# Patient Record
Sex: Female | Born: 1955 | Race: Black or African American | Hispanic: No | Smoking: Never smoker
Health system: Southern US, Community
[De-identification: ages and names within clinical notes are randomized; demographics above are authoritative.]

## PROBLEM LIST (undated history)

## (undated) DIAGNOSIS — J45909 Unspecified asthma, uncomplicated: Secondary | ICD-10-CM

## (undated) DIAGNOSIS — K219 Gastro-esophageal reflux disease without esophagitis: Secondary | ICD-10-CM

## (undated) DIAGNOSIS — E7800 Pure hypercholesterolemia, unspecified: Secondary | ICD-10-CM

## (undated) DIAGNOSIS — D649 Anemia, unspecified: Secondary | ICD-10-CM

## (undated) DIAGNOSIS — F32A Depression, unspecified: Secondary | ICD-10-CM

## (undated) DIAGNOSIS — F329 Major depressive disorder, single episode, unspecified: Secondary | ICD-10-CM

## (undated) DIAGNOSIS — N183 Chronic kidney disease, stage 3 unspecified: Secondary | ICD-10-CM

## (undated) DIAGNOSIS — I2510 Atherosclerotic heart disease of native coronary artery without angina pectoris: Secondary | ICD-10-CM

## (undated) DIAGNOSIS — G4733 Obstructive sleep apnea (adult) (pediatric): Secondary | ICD-10-CM

## (undated) DIAGNOSIS — I10 Essential (primary) hypertension: Secondary | ICD-10-CM

## (undated) DIAGNOSIS — E669 Obesity, unspecified: Secondary | ICD-10-CM

## (undated) HISTORY — PX: TUBAL LIGATION: SHX77

## (undated) HISTORY — PX: NASAL SEPTUM SURGERY: SHX37

## (undated) HISTORY — PX: ABDOMINAL HYSTERECTOMY: SHX81

## (undated) HISTORY — PX: TONSILLECTOMY: SUR1361

## (undated) HISTORY — PX: REPLACEMENT TOTAL KNEE: SUR1224

## (undated) HISTORY — PX: ADENOIDECTOMY: SUR15

---

## 2000-03-08 ENCOUNTER — Emergency Department (HOSPITAL_COMMUNITY): Admission: EM | Admit: 2000-03-08 | Discharge: 2000-03-08 | Payer: Self-pay | Admitting: Emergency Medicine

## 2000-03-08 ENCOUNTER — Encounter: Payer: Self-pay | Admitting: Emergency Medicine

## 2000-04-26 ENCOUNTER — Encounter: Admission: RE | Admit: 2000-04-26 | Discharge: 2000-07-25 | Payer: Self-pay

## 2001-12-22 ENCOUNTER — Emergency Department (HOSPITAL_COMMUNITY): Admission: EM | Admit: 2001-12-22 | Discharge: 2001-12-22 | Payer: Self-pay | Admitting: Emergency Medicine

## 2004-05-04 ENCOUNTER — Emergency Department (HOSPITAL_COMMUNITY): Admission: EM | Admit: 2004-05-04 | Discharge: 2004-05-05 | Payer: Self-pay | Admitting: Emergency Medicine

## 2004-07-31 ENCOUNTER — Emergency Department (HOSPITAL_COMMUNITY): Admission: EM | Admit: 2004-07-31 | Discharge: 2004-08-01 | Payer: Self-pay | Admitting: Emergency Medicine

## 2005-03-04 ENCOUNTER — Emergency Department (HOSPITAL_COMMUNITY): Admission: EM | Admit: 2005-03-04 | Discharge: 2005-03-05 | Payer: Self-pay | Admitting: Emergency Medicine

## 2005-03-15 ENCOUNTER — Emergency Department (HOSPITAL_COMMUNITY): Admission: EM | Admit: 2005-03-15 | Discharge: 2005-03-16 | Payer: Self-pay | Admitting: Emergency Medicine

## 2007-12-01 ENCOUNTER — Emergency Department (HOSPITAL_COMMUNITY): Admission: EM | Admit: 2007-12-01 | Discharge: 2007-12-01 | Payer: Self-pay | Admitting: Emergency Medicine

## 2009-05-20 ENCOUNTER — Ambulatory Visit: Payer: Self-pay | Admitting: Internal Medicine

## 2009-05-21 ENCOUNTER — Inpatient Hospital Stay (HOSPITAL_COMMUNITY): Admission: EM | Admit: 2009-05-21 | Discharge: 2009-05-24 | Payer: Self-pay | Admitting: Emergency Medicine

## 2009-05-22 ENCOUNTER — Encounter: Payer: Self-pay | Admitting: Internal Medicine

## 2009-05-24 ENCOUNTER — Emergency Department (HOSPITAL_COMMUNITY): Admission: EM | Admit: 2009-05-24 | Discharge: 2009-05-24 | Payer: Self-pay | Admitting: Emergency Medicine

## 2009-10-29 ENCOUNTER — Observation Stay (HOSPITAL_COMMUNITY): Admission: EM | Admit: 2009-10-29 | Discharge: 2009-10-30 | Payer: Self-pay | Admitting: Emergency Medicine

## 2009-10-29 ENCOUNTER — Ambulatory Visit: Payer: Self-pay | Admitting: Cardiology

## 2010-12-21 LAB — BASIC METABOLIC PANEL
BUN: 19 mg/dL (ref 6–23)
CO2: 25 mEq/L (ref 19–32)
CO2: 28 mEq/L (ref 19–32)
Calcium: 9.3 mg/dL (ref 8.4–10.5)
Chloride: 105 mEq/L (ref 96–112)
Chloride: 107 mEq/L (ref 96–112)
Chloride: 108 mEq/L (ref 96–112)
Creatinine, Ser: 1.3 mg/dL — ABNORMAL HIGH (ref 0.4–1.2)
Creatinine, Ser: 1.64 mg/dL — ABNORMAL HIGH (ref 0.4–1.2)
GFR calc Af Amer: 51 mL/min — ABNORMAL LOW (ref >60)
GFR calc non Af Amer: 36 mL/min — ABNORMAL LOW (ref >60)
Glucose, Bld: 133 mg/dL — ABNORMAL HIGH (ref 70–99)
Glucose, Bld: 146 mg/dL — ABNORMAL HIGH (ref 70–99)
Potassium: 4 mEq/L (ref 3.5–5.1)
Sodium: 140 mEq/L (ref 135–145)

## 2010-12-21 LAB — CK TOTAL AND CKMB (NOT AT ARMC)
Relative Index: 0.8 (ref 0.0–2.5)
Total CK: 157 U/L (ref 7–177)

## 2010-12-21 LAB — GLUCOSE, CAPILLARY
Glucose-Capillary: 124 mg/dL — ABNORMAL HIGH (ref 70–99)
Glucose-Capillary: 210 mg/dL — ABNORMAL HIGH (ref 70–99)
Glucose-Capillary: 287 mg/dL — ABNORMAL HIGH (ref 70–99)
Glucose-Capillary: 78 mg/dL (ref 70–99)
Glucose-Capillary: 96 mg/dL (ref 70–99)

## 2010-12-21 LAB — DIFFERENTIAL
Basophils Absolute: 0 10*3/uL (ref 0.0–0.1)
Basophils Relative: 0 % (ref 0–1)
Eosinophils Absolute: 0.2 10*3/uL (ref 0.0–0.7)
Eosinophils Absolute: 0.3 10*3/uL (ref 0.0–0.7)
Lymphocytes Relative: 25 % (ref 12–46)
Lymphocytes Relative: 33 % (ref 12–46)
Monocytes Relative: 6 % (ref 3–12)
Monocytes Relative: 7 % (ref 3–12)
Neutro Abs: 4.3 10*3/uL (ref 1.7–7.7)
Neutrophils Relative %: 65 % (ref 43–77)

## 2010-12-21 LAB — POCT CARDIAC MARKERS
CKMB, poc: 1.1 ng/mL (ref 1.0–8.0)
Myoglobin, poc: 131 ng/mL (ref 12–200)
Troponin i, poc: <0.05 ng/mL (ref 0.00–0.09)

## 2010-12-21 LAB — CBC
HCT: 30.8 % — ABNORMAL LOW (ref 36.0–46.0)
Hemoglobin: 10.4 g/dL — ABNORMAL LOW (ref 12.0–15.0)
RBC: 3.77 MIL/uL — ABNORMAL LOW (ref 3.87–5.11)
WBC: 7.6 10*3/uL (ref 4.0–10.5)
WBC: 8.3 10*3/uL (ref 4.0–10.5)

## 2010-12-21 LAB — CARDIAC PANEL(CRET KIN+CKTOT+MB+TROPI)
CK, MB: 1.2 ng/mL (ref 0.3–4.0)
Relative Index: 0.9 (ref 0.0–2.5)
Total CK: 133 U/L (ref 7–177)

## 2010-12-21 LAB — D-DIMER, QUANTITATIVE: D-Dimer, Quant: 0.42 ug/mL-FEU (ref 0.00–0.48)

## 2010-12-22 ENCOUNTER — Emergency Department (HOSPITAL_COMMUNITY): Payer: Non-veteran care

## 2010-12-22 ENCOUNTER — Emergency Department (HOSPITAL_COMMUNITY)
Admission: EM | Admit: 2010-12-22 | Discharge: 2010-12-22 | Disposition: A | Discharge status: Home / Self Care | Payer: Non-veteran care | Attending: Emergency Medicine | Admitting: Emergency Medicine

## 2010-12-22 DIAGNOSIS — Z79899 Other long term (current) drug therapy: Secondary | ICD-10-CM | POA: Insufficient documentation

## 2010-12-22 DIAGNOSIS — R10813 Right lower quadrant abdominal tenderness: Secondary | ICD-10-CM | POA: Insufficient documentation

## 2010-12-22 DIAGNOSIS — I10 Essential (primary) hypertension: Secondary | ICD-10-CM | POA: Insufficient documentation

## 2010-12-22 DIAGNOSIS — Z794 Long term (current) use of insulin: Secondary | ICD-10-CM | POA: Insufficient documentation

## 2010-12-22 DIAGNOSIS — E7800 Pure hypercholesterolemia, unspecified: Secondary | ICD-10-CM | POA: Insufficient documentation

## 2010-12-22 DIAGNOSIS — F3289 Other specified depressive episodes: Secondary | ICD-10-CM | POA: Insufficient documentation

## 2010-12-22 DIAGNOSIS — E119 Type 2 diabetes mellitus without complications: Secondary | ICD-10-CM | POA: Insufficient documentation

## 2010-12-22 DIAGNOSIS — J45909 Unspecified asthma, uncomplicated: Secondary | ICD-10-CM | POA: Insufficient documentation

## 2010-12-22 DIAGNOSIS — M545 Low back pain, unspecified: Secondary | ICD-10-CM | POA: Insufficient documentation

## 2010-12-22 DIAGNOSIS — R1031 Right lower quadrant pain: Secondary | ICD-10-CM | POA: Insufficient documentation

## 2010-12-22 DIAGNOSIS — F329 Major depressive disorder, single episode, unspecified: Secondary | ICD-10-CM | POA: Insufficient documentation

## 2010-12-22 LAB — URINALYSIS, ROUTINE W REFLEX MICROSCOPIC
Bilirubin Urine: NEGATIVE
Hgb urine dipstick: NEGATIVE
Nitrite: NEGATIVE
Specific Gravity, Urine: 1.015 (ref 1.005–1.030)
pH: 5.5 (ref 5.0–8.0)

## 2010-12-22 LAB — CBC
HCT: 35.3 % — ABNORMAL LOW (ref 36.0–46.0)
MCV: 83.5 fL (ref 78.0–100.0)
Platelets: 313 10*3/uL (ref 150–400)
RBC: 4.23 MIL/uL (ref 3.87–5.11)
WBC: 7.6 10*3/uL (ref 4.0–10.5)

## 2010-12-22 LAB — POCT I-STAT, CHEM 8
Calcium, Ion: 1.16 mmol/L (ref 1.12–1.32)
Chloride: 107 mEq/L (ref 96–112)
Creatinine, Ser: 1.5 mg/dL — ABNORMAL HIGH (ref 0.4–1.2)
Potassium: 4.9 mEq/L (ref 3.5–5.1)
TCO2: 26 mmol/L (ref 0–100)

## 2010-12-22 LAB — DIFFERENTIAL
Lymphocytes Relative: 27 % (ref 12–46)
Lymphs Abs: 2.1 10*3/uL (ref 0.7–4.0)
Neutro Abs: 5 10*3/uL (ref 1.7–7.7)
Neutrophils Relative %: 65 % (ref 43–77)

## 2011-01-10 LAB — CARDIAC PANEL(CRET KIN+CKTOT+MB+TROPI)
CK, MB: 1 ng/mL (ref 0.3–4.0)
CK, MB: 1.1 ng/mL (ref 0.3–4.0)
CK, MB: 1.1 ng/mL (ref 0.3–4.0)
CK, MB: 1.2 ng/mL (ref 0.3–4.0)
Relative Index: 1.1 (ref 0.0–2.5)
Relative Index: INVALID (ref 0.0–2.5)
Total CK: 108 U/L (ref 7–177)
Total CK: 114 U/L (ref 7–177)
Total CK: 99 U/L (ref 7–177)
Troponin I: 0.01 ng/mL (ref 0.00–0.06)
Troponin I: 0.01 ng/mL (ref 0.00–0.06)
Troponin I: 0.01 ng/mL (ref 0.00–0.06)

## 2011-01-10 LAB — LIPID PANEL
LDL Cholesterol: 79 mg/dL (ref 0–99)
Triglycerides: 44 mg/dL (ref <150)

## 2011-01-10 LAB — POCT CARDIAC MARKERS
CKMB, poc: <1 ng/mL — ABNORMAL LOW (ref 1.0–8.0)
Myoglobin, poc: 144 ng/mL (ref 12–200)
Troponin i, poc: <0.05 ng/mL (ref 0.00–0.09)
Troponin i, poc: <0.05 ng/mL (ref 0.00–0.09)

## 2011-01-10 LAB — CBC
HCT: 29 % — ABNORMAL LOW (ref 36.0–46.0)
HCT: 29.1 % — ABNORMAL LOW (ref 36.0–46.0)
HCT: 29.5 % — ABNORMAL LOW (ref 36.0–46.0)
HCT: 31.8 % — ABNORMAL LOW (ref 36.0–46.0)
Hemoglobin: 10.5 g/dL — ABNORMAL LOW (ref 12.0–15.0)
Hemoglobin: 9.6 g/dL — ABNORMAL LOW (ref 12.0–15.0)
Hemoglobin: 9.9 g/dL — ABNORMAL LOW (ref 12.0–15.0)
MCHC: 33 g/dL (ref 30.0–36.0)
MCV: 81.3 fL (ref 78.0–100.0)
Platelets: 270 10*3/uL (ref 150–400)
RBC: 3.53 MIL/uL — ABNORMAL LOW (ref 3.87–5.11)
RBC: 3.63 MIL/uL — ABNORMAL LOW (ref 3.87–5.11)
RBC: 3.93 MIL/uL (ref 3.87–5.11)
RDW: 13.8 % (ref 11.5–15.5)
RDW: 14 % (ref 11.5–15.5)
WBC: 7.6 10*3/uL (ref 4.0–10.5)

## 2011-01-10 LAB — DIFFERENTIAL
Eosinophils Relative: 3 % (ref 0–5)
Lymphocytes Relative: 30 % (ref 12–46)
Lymphs Abs: 2.3 10*3/uL (ref 0.7–4.0)
Monocytes Absolute: 0.5 10*3/uL (ref 0.1–1.0)
Monocytes Relative: 7 % (ref 3–12)

## 2011-01-10 LAB — BASIC METABOLIC PANEL
BUN: 11 mg/dL (ref 6–23)
CO2: 25 mEq/L (ref 19–32)
CO2: 27 mEq/L (ref 19–32)
Calcium: 8.3 mg/dL — ABNORMAL LOW (ref 8.4–10.5)
Calcium: 8.9 mg/dL (ref 8.4–10.5)
Chloride: 112 mEq/L (ref 96–112)
Chloride: 112 mEq/L (ref 96–112)
Creatinine, Ser: 1.3 mg/dL — ABNORMAL HIGH (ref 0.4–1.2)
GFR calc Af Amer: 50 mL/min — ABNORMAL LOW (ref >60)
GFR calc Af Amer: 51 mL/min — ABNORMAL LOW (ref >60)
GFR calc non Af Amer: 21 mL/min — ABNORMAL LOW (ref >60)
GFR calc non Af Amer: 42 mL/min — ABNORMAL LOW (ref >60)
Glucose, Bld: 134 mg/dL — ABNORMAL HIGH (ref 70–99)
Glucose, Bld: 140 mg/dL — ABNORMAL HIGH (ref 70–99)
Glucose, Bld: 214 mg/dL — ABNORMAL HIGH (ref 70–99)
Potassium: 4 mEq/L (ref 3.5–5.1)
Potassium: 4.1 mEq/L (ref 3.5–5.1)
Potassium: 4.2 mEq/L (ref 3.5–5.1)
Potassium: 4.2 mEq/L (ref 3.5–5.1)
Sodium: 137 mEq/L (ref 135–145)
Sodium: 139 mEq/L (ref 135–145)
Sodium: 141 mEq/L (ref 135–145)

## 2011-01-10 LAB — GLUCOSE, CAPILLARY
Glucose-Capillary: 106 mg/dL — ABNORMAL HIGH (ref 70–99)
Glucose-Capillary: 118 mg/dL — ABNORMAL HIGH (ref 70–99)
Glucose-Capillary: 132 mg/dL — ABNORMAL HIGH (ref 70–99)
Glucose-Capillary: 160 mg/dL — ABNORMAL HIGH (ref 70–99)
Glucose-Capillary: 169 mg/dL — ABNORMAL HIGH (ref 70–99)
Glucose-Capillary: 187 mg/dL — ABNORMAL HIGH (ref 70–99)
Glucose-Capillary: 193 mg/dL — ABNORMAL HIGH (ref 70–99)
Glucose-Capillary: 248 mg/dL — ABNORMAL HIGH (ref 70–99)
Glucose-Capillary: 65 mg/dL — ABNORMAL LOW (ref 70–99)

## 2011-01-10 LAB — HEMOGLOBIN A1C: Mean Plasma Glucose: 200 mg/dL

## 2011-01-10 LAB — HEPARIN LEVEL (UNFRACTIONATED)
Heparin Unfractionated: 0.4 IU/mL (ref 0.30–0.70)
Heparin Unfractionated: 1.01 IU/mL — ABNORMAL HIGH (ref 0.30–0.70)

## 2011-01-10 LAB — TROPONIN I: Troponin I: 0.01 ng/mL (ref 0.00–0.06)

## 2011-01-10 LAB — TSH: TSH: 1.457 u[IU]/mL (ref 0.350–4.500)

## 2011-01-10 LAB — PROTIME-INR: INR: 1.1 (ref 0.00–1.49)

## 2011-02-17 NOTE — Discharge Summary (Signed)
Tara Patel, Tara Patel             ACCOUNT NO.:  [id number]   MEDICAL RECORD NO.:  [id number]          PATIENT TYPE:  INP   LOCATION:  3735                         FACILITY:  [facility]   PHYSICIAN:  Duke Salvia, MD, FACCDATE OF BIRTH:  [birth year]   DATE OF ADMISSION:  05/20/2009  DATE OF DISCHARGE:                               DISCHARGE SUMMARY   TIME FOR THIS DICTATION:  Greater than 40 minutes.   FINAL DIAGNOSES:  1. Admitted with right-sided chest pain with radiation to the right      arm and tingling in the left arm.      a.     Chest discomfort and radiations are relieved with sublingual       nitroglycerin.  2. Troponin I studies are negative.  3. Electrocardiogram was nondiagnostic for acute coronary syndrome.  4. Elevated creatinine this admission (1.8 creatinine baseline).      Admission creatinine was 2.39.  Mobic will be held at discharge.  5. Myoview stress test was done, May 22, 2009.  Results pending at      the time of this dictation.  They will follow in separate      dictation.   SECONDARY DIAGNOSES:  1. Risk equivalent diabetes.  2. Risk factors are:      a.     Hypertension.      b.     Dyslipidemia.      c.     Family history of coronary artery disease.  3. Chronic renal insufficiency.  4. Depression.  5. Obstructive sleep apnea.  6. Osteoarthritis.   Of note, the patient was noted to be mildly anemic with a hemoglobin of  9.9 at discharge, possibly a result of chronic diseases, hypertension,  diabetes, and chronic renal insufficiency.   BRIEF HISTORY:  Tara Patel is a 55 year old female.  She has a prior  history of diabetes, hypertension, dyslipidemia, and chronic renal  insufficiency.  She presents with chest pain which radiates to both arms  and cause left arm numbness.  She is not short of breath.  She received  nitroglycerin by emergency medical services and the pain resolved.  On  transportation, the patient was given aspirin.  In the  emergency room,  she was started on Plavix and heparin drip.  This was continued until  the patient had a stress test which was done on May 22, 2009.  She  was considered for catheterization, but her elevated creatinine obviated  that option at this admission.  The patient has remained essentially  chest pain-free throughout this admission.  Issues of discharge still to  be resolved and followup blood work of a CBC and basic metabolic panel  at [facility] at office visit for this patient on May 29, 2009, first  to check creatinine and her kidney function and also to check her  anemia.  Her hemoglobin A1c this admission was 8.6.  Her TSH is 1.457.  Of note, the patient will discharge.  If her stress test was negative  for ischemia, it will be interesting to see what her ejection fraction  is.  She is discharged on the following medications.  1. Novolin 70/30 25 units subcutaneously twice daily.      a.     At home she had been taking 52 units subcutaneously twice       daily.  This would have been way too much.  We have told the       patient that she could increase from 25 units twice daily if her       blood sugars start to go up at her home monitoring.  2. Aspirin 81 mg daily.  3. Calcium carbonate with vitamin D 600 mg tablets 2 tablets daily.  4. Fluticasone 1 spray each nostril twice daily.  5. Hydrochlorothiazide 25 mg daily.  6. Labetalol 200 mg.  The patient takes 2 tablets in the morning and 2      tablets in the evening.      a.     The patient was given 1 tablet twice daily and she was told       that she could go back to 2 tablets in the morning and 2 tablets       in the evening but if her blood pressure got too low, say a       systolic below 100 or the heart rate less than 60, she should go       back to 1 tablet in the morning and 1 tablet in the evening.  7. Lisinopril 10 mg daily at bedtime.  8. Omeprazole 20 mg daily.  9. Sertraline 50 mg 3 times daily.   10.Simvastatin 80 mg daily at bedtime.   MEDICATIONS STOPPED OR CHANGED:  Novolin 70/30 52 units changed to 25  units subcutaneously twice daily.  She is asked to stop taking Lasix.  She already has hydrochlorothiazide and she is asked to stop taking  meloxicam for preservation of kidney function.   FOLLOWUP:  Once again, she follows up at [facility] on May 29, 2009  with a basic metabolic panel and a complete blood count.   DISCHARGE LABORATORY DATA:  On the day of discharge, her complete blood  count was 6.6 white cells, hemoglobin 9.9, hematocrit 29.5, and  platelets of 260.  Sodium 141, potassium 4.2, chloride 112, carbonate  25, BUN is 13, creatinine 1.34, and glucose is 214.  Troponins have been  less than 0.05, then less than 0.05, and 0.01 this admission.  Hemoglobin A1c is 8.6.  TSH is 1.457.  The BNP on admission was less  than 30.  The patient had been on Plavix, this was stopped.  The patient  had been on IV heparin, this has also been stopped.      Tara Patel, [place]      Duke Salvia, MD, [facility]  Electronically Signed    GM/MEDQ  D:  05/22/2009  T:  05/23/2009  Job:  [id number]   cc:   [facility]

## 2011-02-17 NOTE — Discharge Summary (Signed)
Tara Patel, Tara Patel             ACCOUNT NO.:  [id number]   MEDICAL RECORD NO.:  [id number]           PATIENT TYPE:   LOCATION:                                 FACILITY:   PHYSICIAN:  Peter C. Eden Emms, MD, FACCDATE OF BIRTH:  [birth year]   DATE OF ADMISSION:  DATE OF DISCHARGE:                               DISCHARGE SUMMARY   ADDENDUM   Lisinopril dose increased from 10 to 20 mg p.o. daily and followup  appointment with Dr. Verne Carrow made for July 03, 2009 at  1:45 p.m.      Jarrett Ables, PAC      Peter C. Eden Emms, MD, [facility]  Electronically Signed    MS/MEDQ  D:  05/24/2009  T:  05/25/2009  Job:  [id number]

## 2011-02-17 NOTE — H&P (Signed)
Tara Patel, FACEY NO.:  [id number]   MEDICAL RECORD NO.:  [id number]          PATIENT TYPE:  EMS   LOCATION:  MAJO                         FACILITY:  [facility]   PHYSICIAN:  Therisa Doyne, MD    DATE OF BIRTH:  02/17/1956   DATE OF ADMISSION:  05/20/2009  DATE OF DISCHARGE:                              HISTORY & PHYSICAL   PRIMARY CARE Tara Patel:  [facility].   CHIEF COMPLAINT:  Chest pain.   HISTORY OF PRESENT ILLNESS:  A 55 year old [place] American female with  a past medical history significant for diabetes, hypertension,  hyperlipidemia and chronic renal insufficiency who presents for  evaluation of left-sided chest pain.  The patient's states that she was  in her usual state of health this evening and ate supper.  Subsequently  at 8:00 p.m., she developed a throbbing sensation in her left chest.  She noted that it radiated into her right arm and had tingling in her  left arm.  There was no associated shortness of breath or diaphoresis.  Because of these symptoms, she called emergency medical services who  gave her nitroglycerin and the symptoms alleviated.  In the emergency  department she was seen and evaluated where she had a normal EKG.  However, the emergency department physicians were concerned about her  chest pain, so they loaded her with Plavix, as well as started her on a  nitroglycerin drip and heparin drip for systemic anticoagulation.   The patient denies any previous history of cardiac disease, denying  previous heart catheterizations or prior myocardial infarctions.  At  baseline, she is quite sedentary and states that the most active thing  she does is walk from her car to her house.  She denies any history of  exertional chest pain, CHF, PND, orthopnea or lower extremity edema.  She denies any syncope.   REVIEW OF SYSTEMS:  All systems reviewed except and are negative except  as mentioned above in the history of present illness.   PAST MEDICAL HISTORY:  1. Diabetes.  2. Hypertension.  3. Hyperlipidemia.  4. Gastroesophageal reflux disease.  5. Depression.  6. Obstructive sleep apnea.  7. Osteoarthritis.   SOCIAL HISTORY:  The patient is unemployed.  Prior to that, she was a  CNA.  She denies tobacco, alcohol or drugs.   FAMILY HISTORY:  Positive for coronary disease in her mother at the age  of 66, who underwent coronary artery bypass grafting.   ALLERGIES:  NO KNOWN DRUG ALLERGIES.   MEDICATIONS:  1. Novolin 70/30 insulin 52 units b.i.d.  2. Aspirin 81 mg daily.  3. Calcium 1200 mg daily.  4. Lasix 40 mg daily.  5. Hydrochlorothiazide 25 mg daily.  6. Lisinopril 10 mg daily.  7. Omeprazole 20 mg daily.  8. Meloxicam 15 mg daily.  9. Simvastatin 80 mg daily.  10.Labetalol 200 mg b.i.d.  11.Sertraline 50 mg daily.  12.Fluticasone b.i.d.   PHYSICAL EXAMINATION:  VITAL SIGNS:  Temperature afebrile, blood  pressure is 112/58, pulse 86, respirations 18.  Oxygen saturation 99%  room air.  GENERAL:  No acute distress.  HEENT:  Normocephalic, atraumatic.  NECK:  Supple.  No lymphadenopathy or jugulovenous distention.  No  masses.  CARDIOVASCULAR:  Regular rate and rhythm.  No murmurs, rubs or gallops.  CHEST:  Clear to auscultation bilaterally.  ABDOMEN:  Positive bowel sounds.  Soft, nontender and nondistended.  EXTREMITIES:  No clubbing, cyanosis or edema.  Dorsalis pedis pulse 2+  bilaterally.  SKIN:  No rashes.  BACK:  No CVA tenderness.  NEUROLOGIC:  Cranial nerves II through XII are grossly intact with no  focal deficits.   PERTINENT DATA:  EKG shows normal sinus rhythm at 80 beats per minute,  normal axis, normal intervals, no active signs of ischemia, no Q-waves.   LABORATORY STUDIES:  Show a hemoglobin of 10.52, troponin less than 0.08  elevated BUN and creatinine of 26 and 2.4, glucose 134.   Chest x-ray showed no acute cardiopulmonary disease section.   ASSESSMENT/PLAN:  Tara Patel is  a 55 year old African American female  with multiple cardiovascular risk factors, including hypertension,  hyperlipidemia, diabetes, and chronic renal insufficiency, who presents  for evaluation of 30 minutes of chest pain.   1. We will admit the patient to [facility] Cardiology Service to the      unassigned physician.   1. Chest pain.  It certainly is reassuring that her EKG is normal as      are her cardiac biomarkers.  However, she has multiple risk factors      for cardiac disease.  Because of that, we will go ahead and admit      the patient and rule her out for myocardial infarction with serial      cardiac enzymes.  She has received a Plavix load in the emergency      department, as well as a heparin drip.  We will go ahead and      continue on aspirin 325 mg daily, as well as Plavix 75 mg daily.      Continue on her home dose of simvastatin and continue systemic      anticoagulation with heparin.  She is to continue on labetalol.  We      will not use a 2b/3a inhibitor unless the patient rules in for      myocardial infarction.  If she rules out for an myocardial      infarction, then I think a stress test would be a reasonable to      help risk stratify the patient.  I think this would be the best      choice given her chronic renal insufficiency.  However, if she has      high risk features such as abnormal electrocardiograms or positive      biomarkers, then we may need to consider a cardiac catheterization.   1. Hypertension, appears fairly well-controlled.  Continue      hydrochlorothiazide, lisinopril and labetalol.  We will be holding      her Lasix.   1. Chronic renal insufficiency.  Back in 2009, she had a creatinine of      1.8.  Currently, her creatinine is 2.4.  This may be her new      baseline or she may have some mild acute on chronic renal      insufficiency.  We will discontinue her Lasix as she is already on      one diuretic.  We will continue her on ACE  inhibitor for now as      this is nephro protective  in the setting of chronic renal      insufficiency.   1. Hyperlipidemia.  Continue simvastatin.  Check a fasting lipid      profile.   1. Diabetes mellitus.  We will check a hemoglobin A1c.  We will start      her home insulin regimen at half dosing as she will be n.p.o. for      possible stress test.   1. Gastroesophageal reflux disease.  Continue Nexium.   1. Depression.  Continue sertraline.   1. Fluid/Electrolyte/Nutrition/IV fluids:  Electrolytes are stable.      N.p.o.   1. DVT prophylaxis not indicated as the patient is on systemic      anticoagulation with heparin.      Therisa Doyne, MD  Electronically Signed     SJT/MEDQ  D:  05/21/2009  T:  05/21/2009  Job:  [id number]

## 2011-02-17 NOTE — Cardiovascular Report (Signed)
Tara Patel, Tara Patel             ACCOUNT NO.:  [id number]   MEDICAL RECORD NO.:  [id number]          PATIENT TYPE:  INP   LOCATION:  3735                         FACILITY:  [facility]   PHYSICIAN:  Verne Carrow, MDDATE OF BIRTH:  1956/08/29   DATE OF PROCEDURE:  05/23/2009  DATE OF DISCHARGE:                            CARDIAC CATHETERIZATION   PRIMARY CARDIOLOGIST:  Madolyn Frieze. Jens Som, MD, [facility]   PROCEDURE PERFORMED:  1. Left heart catheterization.  2. Selective coronary angiography.  3. Left ventricular angiogram.   OPERATOR:  Verne Carrow, MD   INDICATIONS:  Chest pain in a 55 year old patient with history of  diabetes mellitus, hypertension and hyperlipidemia.  The patient ruled  out for myocardial infarction with serial cardiac enzymes but was  referred for stress Myoview which showed possible anterior wall  ischemia.  She was referred for a diagnostic left heart catheterization  today to assess for obstructive coronary artery disease.   PROCEDURE IN DETAIL:  The patient was brought to the main cardiac  catheterization laboratory after signing informed consent for the  procedure.  The right groin was prepped and draped in a sterile fashion.  A 1% lidocaine was used for local anesthesia.  A 5-French sheath was  inserted into the right femoral artery without difficulty.  Standard  diagnostic catheters were used to perform selective coronary  angiography.  A pigtail catheter was used to perform the left  ventricular angiogram.  The patient tolerated the procedure well and was  taken to the holding area in stable condition.   HEMODYNAMIC FINDINGS:  Central aortic pressure 150/76.  Left ventricular  pressure 162/13.  Left ventricular end-diastolic pressure 17.   ANGIOGRAPHIC FINDINGS:  1. Left main coronary artery had no significant disease noted.  This      vessel bifurcated into the LAD and the circumflex.  2. The left anterior descending is a moderate-to-large  sized vessel      that courses to the apex.  It gives off several small diagonal      branches.  There is a 30% tubular stenosis noted throughout the      proximal portion.  There are serial 30% lesions noted throughout      the midportion of the vessel.  There is no obstructive disease      noted in this vessel.  3. The circumflex artery is a large-caliber vessel that gives off two      large marginal branches.  There is no obstructive disease in this      vessel.  4. The right coronary artery is a large dominant vessel that has no      evidence of obstructive disease.  5. Left ventricular angiogram was performed in the RAO projection that      shows normal left ventricular systolic function with no wall motion      abnormalities.  Ejection fraction 65-70%.   IMPRESSION:  1. Mild nonobstructive coronary artery disease.  2. Normal left ventricular systolic function.   RECOMMENDATIONS:  I recommend continued medical management in this  patient.      Verne Carrow, MD  Electronically Signed  CM/MEDQ  D:  05/23/2009  T:  05/24/2009  Job:  [id number]

## 2011-03-24 ENCOUNTER — Emergency Department (HOSPITAL_COMMUNITY)
Admission: EM | Admit: 2011-03-24 | Discharge: 2011-03-25 | Disposition: A | Discharge status: Home / Self Care | Payer: Self-pay | Attending: Emergency Medicine | Admitting: Emergency Medicine

## 2011-03-24 DIAGNOSIS — J039 Acute tonsillitis, unspecified: Secondary | ICD-10-CM | POA: Insufficient documentation

## 2011-03-24 DIAGNOSIS — I10 Essential (primary) hypertension: Secondary | ICD-10-CM | POA: Insufficient documentation

## 2011-03-24 DIAGNOSIS — R509 Fever, unspecified: Secondary | ICD-10-CM | POA: Insufficient documentation

## 2011-03-25 LAB — POCT I-STAT, CHEM 8
BUN: 18 mg/dL (ref 6–23)
Chloride: 101 mEq/L (ref 96–112)
Glucose, Bld: 303 mg/dL — ABNORMAL HIGH (ref 70–99)
HCT: 36 % (ref 36.0–46.0)
Potassium: 4.2 mEq/L (ref 3.5–5.1)

## 2011-06-26 LAB — I-STAT 8, (EC8 V) (CONVERTED LAB)
BUN: 16
Bicarbonate: 27.4 — ABNORMAL HIGH
Glucose, Bld: 143 — ABNORMAL HIGH
Operator id: 192351
pCO2, Ven: 49.1

## 2011-06-26 LAB — POCT CARDIAC MARKERS
CKMB, poc: <1 — ABNORMAL LOW
Myoglobin, poc: 141
Operator id: 133351
Operator id: 192351
Troponin i, poc: <0.05
Troponin i, poc: <0.05

## 2011-10-08 ENCOUNTER — Emergency Department (HOSPITAL_COMMUNITY): Payer: Non-veteran care

## 2011-10-08 ENCOUNTER — Encounter: Payer: Self-pay | Admitting: Emergency Medicine

## 2011-10-08 ENCOUNTER — Other Ambulatory Visit: Payer: Self-pay

## 2011-10-08 ENCOUNTER — Emergency Department (HOSPITAL_COMMUNITY)
Admission: EM | Admit: 2011-10-08 | Discharge: 2011-10-08 | Disposition: A | Discharge status: Home / Self Care | Payer: Non-veteran care | Attending: Emergency Medicine | Admitting: Emergency Medicine

## 2011-10-08 DIAGNOSIS — R0789 Other chest pain: Secondary | ICD-10-CM | POA: Insufficient documentation

## 2011-10-08 DIAGNOSIS — E785 Hyperlipidemia, unspecified: Secondary | ICD-10-CM | POA: Diagnosis present

## 2011-10-08 DIAGNOSIS — I10 Essential (primary) hypertension: Secondary | ICD-10-CM | POA: Diagnosis present

## 2011-10-08 DIAGNOSIS — R079 Chest pain, unspecified: Secondary | ICD-10-CM | POA: Diagnosis present

## 2011-10-08 DIAGNOSIS — E669 Obesity, unspecified: Secondary | ICD-10-CM | POA: Diagnosis present

## 2011-10-08 DIAGNOSIS — I129 Hypertensive chronic kidney disease with stage 1 through stage 4 chronic kidney disease, or unspecified chronic kidney disease: Secondary | ICD-10-CM | POA: Insufficient documentation

## 2011-10-08 DIAGNOSIS — Z794 Long term (current) use of insulin: Secondary | ICD-10-CM | POA: Insufficient documentation

## 2011-10-08 DIAGNOSIS — E119 Type 2 diabetes mellitus without complications: Secondary | ICD-10-CM | POA: Diagnosis present

## 2011-10-08 DIAGNOSIS — N183 Chronic kidney disease, stage 3 unspecified: Secondary | ICD-10-CM | POA: Diagnosis present

## 2011-10-08 DIAGNOSIS — R110 Nausea: Secondary | ICD-10-CM | POA: Insufficient documentation

## 2011-10-08 DIAGNOSIS — M79609 Pain in unspecified limb: Secondary | ICD-10-CM | POA: Insufficient documentation

## 2011-10-08 DIAGNOSIS — G4733 Obstructive sleep apnea (adult) (pediatric): Secondary | ICD-10-CM | POA: Diagnosis present

## 2011-10-08 DIAGNOSIS — E7800 Pure hypercholesterolemia, unspecified: Secondary | ICD-10-CM | POA: Insufficient documentation

## 2011-10-08 HISTORY — DX: Chronic kidney disease, stage 3 unspecified: N18.30

## 2011-10-08 HISTORY — DX: Obstructive sleep apnea (adult) (pediatric): G47.33

## 2011-10-08 HISTORY — DX: Obesity, unspecified: E66.9

## 2011-10-08 HISTORY — DX: Chronic kidney disease, stage 3 (moderate): N18.3

## 2011-10-08 HISTORY — DX: Essential (primary) hypertension: I10

## 2011-10-08 HISTORY — DX: Pure hypercholesterolemia, unspecified: E78.00

## 2011-10-08 LAB — BASIC METABOLIC PANEL
CO2: 27 mEq/L (ref 19–32)
Calcium: 9.6 mg/dL (ref 8.4–10.5)
Chloride: 106 mEq/L (ref 96–112)
Sodium: 143 mEq/L (ref 135–145)

## 2011-10-08 LAB — CARDIAC PANEL(CRET KIN+CKTOT+MB+TROPI)
CK, MB: 2.1 ng/mL (ref 0.3–4.0)
CK, MB: 2 ng/mL (ref 0.3–4.0)
Relative Index: 1.2 (ref 0.0–2.5)
Total CK: 174 U/L (ref 7–177)

## 2011-10-08 LAB — POCT I-STAT, CHEM 8
Calcium, Ion: 1.2 mmol/L (ref 1.12–1.32)
Chloride: 106 mEq/L (ref 96–112)
Creatinine, Ser: 1.2 mg/dL — ABNORMAL HIGH (ref 0.50–1.10)
Glucose, Bld: 88 mg/dL (ref 70–99)
HCT: 35 % — ABNORMAL LOW (ref 36.0–46.0)

## 2011-10-08 LAB — PROTIME-INR: Prothrombin Time: 13.4 seconds (ref 11.6–15.2)

## 2011-10-08 LAB — DIFFERENTIAL
Basophils Absolute: 0 10*3/uL (ref 0.0–0.1)
Lymphocytes Relative: 34 % (ref 12–46)
Neutro Abs: 4.9 10*3/uL (ref 1.7–7.7)

## 2011-10-08 LAB — GLUCOSE, CAPILLARY: Glucose-Capillary: 70 mg/dL (ref 70–99)

## 2011-10-08 LAB — CBC
Platelets: 253 10*3/uL (ref 150–400)
RDW: 13 % (ref 11.5–15.5)
WBC: 8.3 10*3/uL (ref 4.0–10.5)

## 2011-10-08 MED ORDER — MORPHINE SULFATE 2 MG/ML IJ SOLN
2.0000 mg | Freq: Once | INTRAMUSCULAR | Status: AC
  Administered 2011-10-08: 2 mg via INTRAVENOUS
  Filled 2011-10-08: qty 1

## 2011-10-08 MED ORDER — ONDANSETRON HCL 4 MG/2ML IJ SOLN
4.0000 mg | Freq: Once | INTRAMUSCULAR | Status: AC
  Administered 2011-10-08: 4 mg via INTRAVENOUS
  Filled 2011-10-08: qty 2

## 2011-10-08 NOTE — ED Provider Notes (Signed)
History     CSN: [id number]  Arrival date & time 10/08/11  0865   First MD Initiated Contact with Patient 10/08/11 [phone]      Chief Complaint  Patient presents with  . Chest Pain    (Consider location/radiation/quality/duration/timing/severity/associated sxs/prior treatment) HPI Comments: Patient presents with left-sided chest pain that woke her from sleep around 5:30. Pain radiates into her right arm. Is associated with nausea, no vomiting, no shortness of breath. She took 3 nitroglycerin without relief. Nothing makes the pain better. She similar pain she had 2 years ago when she had abnormal stress test and catheterization showed nonobstructive disease. She reports she is not had pain in the past 2 years ever since her last hospitalization August 2010. Is a history of hyperlipidemia, hypertension, diabetes and chronic in disease.  Patient is a 56 y.o. female presenting with chest pain. The history is provided by the patient.  Chest Pain The chest pain began 1 - 2 hours ago. Chest pain occurs constantly. The chest pain is unchanged. The severity of the pain is moderate. The quality of the pain is described as similar to previous episodes and aching. Primary symptoms include nausea. Pertinent negatives for primary symptoms include no fever, no shortness of breath, no cough, no abdominal pain, no vomiting and no dizziness.     Past Medical History  Diagnosis Date  . Diabetes mellitus   . Hypertension   . Hypercholesteremia   . Obesity   . CKD (chronic kidney disease) stage 3, GFR 30-59 ml/min   . OSA (obstructive sleep apnea)   . Chest pain     Mild nonobstructive CAD (by cath Aug '10)    Past Surgical History  Procedure Date  . Tonsillectomy   . Nose surgery     History reviewed. No pertinent family history.  History  Substance Use Topics  . Smoking status: Never Smoker   . Smokeless tobacco: Not on file  . Alcohol Use: No    OB History    Grav Para Term Preterm  Abortions TAB SAB Ect Mult Living                  Review of Systems  Constitutional: Positive for activity change and appetite change. Negative for fever.  HENT: Negative for congestion and rhinorrhea.   Respiratory: Negative for cough and shortness of breath.   Cardiovascular: Positive for chest pain.  Gastrointestinal: Positive for nausea. Negative for vomiting and abdominal pain.  Genitourinary: Negative for dysuria.  Musculoskeletal: Negative for back pain.  Skin: Negative for rash.  Neurological: Negative for dizziness.    Allergies  Review of patient's allergies indicates no known allergies.  Home Medications   Current Outpatient Rx  Name Route Sig Dispense Refill  . ACETAMINOPHEN 325 MG PO TABS Oral Take 650 mg by mouth every 6 (six) hours as needed. Pain or headache     . ASPIRIN EC 81 MG PO TBEC Oral Take 81 mg by mouth daily.      Marland Kitchen CALCIUM CARBONATE-VITAMIN D 600-400 MG-UNIT PO TABS Oral Take 2 tablets by mouth daily.      Marland Kitchen FERROUS SULFATE 325 (65 FE) MG PO TABS Oral Take 325 mg by mouth 2 (two) times daily.      Marland Kitchen HYDROCHLOROTHIAZIDE 25 MG PO TABS Oral Take 25 mg by mouth daily.      . INSULIN ASPART 100 UNIT/ML [place] SOLN Subcutaneous Inject 10-20 Units into the skin 3 (three) times daily before meals. Takes 10  at breakfast and lunch Takes 20 at supper     . INSULIN GLARGINE 100 UNIT/ML [place] SOLN Subcutaneous Inject 50 Units into the skin at bedtime.      Marland Kitchen LABETALOL HCL 200 MG PO TABS Oral Take 200 mg by mouth 2 (two) times daily.      Marland Kitchen LISINOPRIL 40 MG PO TABS Oral Take 40 mg by mouth daily.      Marland Kitchen LORATADINE 10 MG PO TABS Oral Take 10 mg by mouth daily. allergies     . OMEPRAZOLE 20 MG PO CPDR Oral Take 20 mg by mouth daily.      Bernadette Hoit SODIUM 8.6-50 MG PO TABS Oral Take 1 tablet by mouth daily. constipation     . SERTRALINE HCL 50 MG PO TABS Oral Take 50 mg by mouth 3 (three) times daily.      Marland Kitchen SIMVASTATIN 40 MG PO TABS Oral Take 20 mg by mouth at  bedtime.        BP 111/68  Pulse 73  Temp(Src) 97.9 F (36.6 C) (Oral)  Resp 14  SpO2 98%  Physical Exam  Constitutional: She is oriented to person, place, and time. She appears well-developed. No distress.       Uncomfortable  HENT:  Head: Normocephalic and atraumatic.  Mouth/Throat: Oropharynx is clear and moist. No oropharyngeal exudate.  Eyes: Conjunctivae are normal. Pupils are equal, round, and reactive to light.  Neck: Normal range of motion. Neck supple.  Cardiovascular: Normal rate, regular rhythm and normal heart sounds.        Intact peripheral pulses  Pulmonary/Chest: Effort normal and breath sounds normal. No respiratory distress.  Abdominal: Soft. There is no tenderness. There is no rebound and no guarding.  Musculoskeletal: Normal range of motion. She exhibits no edema and no tenderness.  Neurological: She is oriented to person, place, and time. No cranial nerve deficit.  Skin: Skin is warm.    ED Course  Procedures (including critical care time)  Labs Reviewed  CBC - Abnormal; Notable for the following:    Hemoglobin 11.1 (*)    HCT 33.8 (*)    All other components within normal limits  BASIC METABOLIC PANEL - Abnormal; Notable for the following:    Creatinine, Ser 1.22 (*)    GFR calc non Af Amer 48 (*)    GFR calc Af Amer 56 (*)    All other components within normal limits  POCT I-STAT, CHEM 8 - Abnormal; Notable for the following:    Creatinine, Ser 1.20 (*)    Hemoglobin 11.9 (*)    HCT 35.0 (*)    All other components within normal limits  DIFFERENTIAL  CARDIAC PANEL(CRET KIN+CKTOT+MB+TROPI)  PROTIME-INR  CARDIAC PANEL(CRET KIN+CKTOT+MB+TROPI)  GLUCOSE, CAPILLARY   Dg Chest 2 View  10/08/2011  *RADIOLOGY REPORT*  Clinical Data: Chest pain.  CHEST - 2 VIEW  Comparison: Chest x-ray 10/28/2009.  Findings: The cardiac silhouette, mediastinal and hilar contours are within normal limits and stable.  Chronic bronchitic type lung changes.  No effusion  or infiltrate.  IMPRESSION: Chronic bronchitic type lung changes without infiltrate or effusion.  Original Report Authenticated By: P. Loralie Champagne, M.D.     1. Chest pain   2. Hyperlipidemia   3. Hypertension       MDM  Left-sided chest pain radiating into the right arm and nausea shortness of breath. Similar to previous episodes. Unrelieved by nitroglycerin.   Patient does have risk factors of diabetes, hyperlipidemia, CAD. Her  pain however is atypical and similar to previous episodes. She did have a cardiac catheterization August 2010 which showed nonobstructive disease.  Case discussed with cardiology Dr. Johney Frame who came to evaluate the patient. They agree her symptoms are atypical and similar to previous.  Recommend second set of enzymes and outpatient followup.  Two sets of enzymes negative.  Pain improved.  [facility]   Date: 10/08/2011  Rate: 82  Rhythm: normal sinus rhythm  QRS Axis: normal  Intervals: normal  ST/T Wave abnormalities: nonspecific ST/T changes  Conduction Disutrbances:none  Narrative Interpretation: anterior T wave flattening  Old EKG Reviewed: none available          Glynn Octave, MD 10/08/11 1831

## 2011-10-08 NOTE — Consult Note (Signed)
Consult Note  Patient ID: EATHER CHAIRES MRN: [id number], SOB: 1956-04-06 56 y.o. Date of Encounter: 10/08/2011, 8:51 AM  Primary Physician: Ria Clock Primary Cardiologist: Verne Carrow MD  Chief Complaint: Chest Pain Reason for Consultation: Chest Pain  HPI: 55yof w/ PMHx significant for OSA, obesity, CKD (stage 3), HLD, HTN, DM, and mild Nonobstructive CAD (by cath Aug '10) who presented to [facility] on 10/08/2011 with complaints of right arm and chest pain.  She had similar type pain in August 2010 at which time she underwent a stress Myoview that revealed possible anterior wall ischemia. A heart catheterization was performed that revealed mild nonobstructive CAD with EF 65-70%. She presented to [facility] again in January 2011 with chest pain that was ruled out for acute ischemia and thought to be related to GERD or gastroparesis.   She reports being in her usual state of health when she woke up at around 5:30 this morning to use the bathroom. When she returned to bed she developed a dull pain in her right arm followed by right-sided throbbing chest pain that was an 8/10. After she took SL NTG x3 as well as 162mg  ASA without relief and then presented to the ED. She reports some mild nausea with the pain, but no shortness of breath, diaphoresis, dizziness or other associated symptoms. She denies any recent illness, fever, chills, sweats, weight change, edema, orthopnea, pre/syncope, changes in bowel or bladder. She walks about a day without c/o chest pain, but does have stable dyspnea on exertion after about of walking. Her blood sugars have been more elevated (in the 200s) in the last couple of weeks.  In the ED she received morphine and zofran which relieved her pain, but she reports intermittent sharp chest pains that last a few seconds as well as intermittent dull right arm pain. She is chest pain free now and has reproducible arm and chest pain with  palpation. Her EKG revealed Normal Sinus Rhythm 82bpm, No acute ST/T wave changes, initial cardiac enzyme was negative, and CXR was without acute cardiopulmonary findings.   Diagnosis  . Diabetes mellitus  . Hypertension  . Hypercholesteremia    Surgical History:  Procedure  . Tonsillectomy  . Nose surgery    Home Meds: Medication Sig  acetaminophen (TYLENOL) 325 MG tablet Take 650 mg by mouth every 6 (six) hours as needed. Pain or headache   aspirin EC 81 MG tablet Take 81 mg by mouth daily.    Calcium Carbonate-Vitamin D (CALCIUM 600+D) 600-400 MG-UNIT per tablet Take 2 tablets by mouth daily.    ferrous sulfate 325 (65 FE) MG tablet Take 325 mg by mouth 2 (two) times daily.    hydrochlorothiazide (HYDRODIURIL) 25 MG tablet Take 25 mg by mouth daily.    insulin aspart (NOVOLOG) 100 UNIT/ML injection Inject 10-20 Units into the skin 3 (three) times daily before meals. Takes 10 at breakfast and lunch Takes 20 at supper   insulin glargine (LANTUS) 100 UNIT/ML injection Inject 50 Units into the skin at bedtime.    labetalol (NORMODYNE) 200 MG tablet Take 200 mg by mouth 2 (two) times daily.    lisinopril (PRINIVIL,ZESTRIL) 40 MG tablet Take 40 mg by mouth daily.    loratadine (CLARITIN) 10 MG tablet Take 10 mg by mouth daily. allergies   omeprazole (PRILOSEC) 20 MG capsule Take 20 mg by mouth daily.    senna-docusate (SENOKOT-S) 8.6-50 MG per tablet Take 1 tablet by mouth daily. constipation  sertraline (ZOLOFT) 50 MG tablet Take 50 mg by mouth 3 (three) times daily.    simvastatin (ZOCOR) 40 MG tablet Take 20 mg by mouth at bedtime.      Allergies: No Known Allergies  Social History  . Marital Status: Divorced   Lives in [place] with her parents    Occupational History  . Unemployed   Social History Main Topics  . Smoking status: Never Smoker   . Alcohol Use: No  . Drug Use: No   Family History: Mother had CABG at 79yo  Review of Systems: General: negative for  chills, fever, night sweats or weight changes.  Cardiovascular: As per HPI; otherwise negative for edema, orthopnea, palpitations, paroxysmal nocturnal dyspnea, or shortness of breath Dermatological: negative for rash Respiratory: negative for cough or wheezing Urologic: negative for hematuria Abdominal: negative for vomiting, diarrhea, bright red blood per rectum, melena, or hematemesis Neurologic: negative for visual changes, syncope, or dizziness All other systems reviewed and are otherwise negative except as noted above.  Labs:   Component Value Date   WBC 8.3 10/08/2011   HGB 11.9* 10/08/2011   HCT 35.0* 10/08/2011   MCV 81.6 10/08/2011   PLT 253 10/08/2011    Lab 10/08/11 0739 10/08/11 0711  NA 143 --  K 3.8 --  CL 106 --  CO2 -- 27  BUN 18 --  CREATININE 1.20* --  CALCIUM -- 9.6  GLUCOSE 88 --   Basename 10/08/11 0714  CKTOTAL 174  CKMB 2.1  TROPONINI <0.30     10/08/2011 07:11  Prothrombin Time 13.4  INR 1.00    Radiology/Studies:  Dg Chest 2 View 10/08/2011  Findings: The cardiac silhouette, mediastinal and hilar contours are within normal limits and stable.  Chronic bronchitic type lung changes.  No effusion or infiltrate.  IMPRESSION: Chronic bronchitic type lung changes without infiltrate or effusion.     EKG: 10/08/11 @ 0644 - Normal Sinus Rhythm 82bpm, No acute ST/T wave changes  Physical Exam: Blood pressure 108/44, pulse 90, temperature 98.6 F (37 C), temperature source Oral, resp. rate 18, SpO2 99.00%. General: Overweight black female, in no acute distress. Head: Normocephalic, atraumatic, sclera non-icteric, nares are without discharge Neck: Supple. Negative for carotid bruits. JVD not elevated. Lungs: Clear bilaterally to auscultation without wheezes, rales, or rhonchi. Breathing is unlabored. Heart: RRR with S1 S2. No murmurs, rubs, or gallops appreciated. Abdomen: Soft, non-tender, non-distended with normoactive bowel sounds. No rebound/guarding. No obvious  abdominal masses. Msk:  Strength and tone appear normal for age. Extremities:  Trace bilat LE edema. No clubbing or cyanosis. Distal pedal pulses are 2+ and equal bilaterally. Neuro: Alert and oriented X 3. Moves all extremities spontaneously. Psych:  Responds to questions appropriately with a flat affect.   ASSESSMENT AND PLAN:  55yof w/ PMHx significant for OSA, obesity, CKD (stage 3), HLD, HTN, DM, mild Nonobstructive CAD (by cath Aug '10) who presented to [facility] on 10/08/2011 with complaints of chest pain.  1. Chest pain:  She had mild nonobstructive CAD by cath in Aug 2010. EKG and initial cardiac enzyme without objective evidence of acute ischemia, however, she has multiple risk factors for CAD including obesity, HLD, HTN, DM, and family history. Her pain is atypical and reproducible with palpation and is most likely musculoskeletal in nature. Would recommend repeating cardiac enzymes 6hrs after initial set. If negative can follow up with her PCP as an outpatient. Cont ASA, BB, statin, and PRN nitro. Take Tylenol as needed for pain.  If she develops shortness of breath or worsening pain return to the emergency room.  2. HTN: Stable.  3. HLD: Cont statin  4. Diabetes Mellitus: She reports elevated blood sugars recently, however, glucose 88 here. Cont home meds.  5. CKD Stage 3: Creatinine stable at 1.20.   Signed, HOPE, JESSICA PA-C 10/08/2011, 8:51 AM  I have seen, examined the patient, and reviewed the above assessment and plan. Briefly, 56 yo AAF with h/o atypical chest pain and low risk cath.  She now returns with right arm and right upper chest discomfort.  Pain not improved with slNTG but resolved with morphine.  She is presently chest pain free.  First set of CMs are negative and EKG is normal.  On exam, she appears very comfortable.  She has mild ttp over the R antecubital fossa and also R pectoralis region which is well localized and per pt reproduces her clinical  pain. Her pain appears to be musculoskeletal in nature. If second set of CMs are normal, she can be discharged with outpatient follow-up by PCP.  Tylenol as needed for pain. She should return if chest pain worsens or she develops SOB or other concerns.  Co Sign: Hillis Range, MD 10/08/2011 10:16 AM

## 2011-10-08 NOTE — ED Notes (Signed)
Pt in bed resting with lights low, pt requests to have CBG completed, pt states, "I want to have my sugar checked I am feeling sweaty. I feel like my sugar is low."

## 2011-10-08 NOTE — ED Notes (Signed)
Pt discharged home. Encouraged to follow up with PCP. 

## 2011-10-08 NOTE — ED Notes (Signed)
PT. REPORTS LEFT CHEST PAIN AND RIGHT ARM PAIN ONSET THIS MORNING ,  DENIES SOB , SLIGHT NAUSEA , NO VOMITTING OR DIAPHORESIS,  TOOK 3 NTG SL AND 2 BABY ASA PRIOR TO ARRIVAL WITH NO RELIEF.

## 2013-02-25 ENCOUNTER — Encounter (HOSPITAL_COMMUNITY): Payer: Self-pay

## 2013-02-25 ENCOUNTER — Emergency Department (HOSPITAL_COMMUNITY)
Admission: EM | Admit: 2013-02-25 | Discharge: 2013-02-25 | Disposition: A | Discharge status: Home / Self Care | Payer: Non-veteran care | Attending: Emergency Medicine | Admitting: Emergency Medicine

## 2013-02-25 DIAGNOSIS — R6883 Chills (without fever): Secondary | ICD-10-CM | POA: Insufficient documentation

## 2013-02-25 DIAGNOSIS — R05 Cough: Secondary | ICD-10-CM | POA: Insufficient documentation

## 2013-02-25 DIAGNOSIS — E119 Type 2 diabetes mellitus without complications: Secondary | ICD-10-CM | POA: Insufficient documentation

## 2013-02-25 DIAGNOSIS — Z79899 Other long term (current) drug therapy: Secondary | ICD-10-CM | POA: Insufficient documentation

## 2013-02-25 DIAGNOSIS — Z7982 Long term (current) use of aspirin: Secondary | ICD-10-CM | POA: Insufficient documentation

## 2013-02-25 DIAGNOSIS — I129 Hypertensive chronic kidney disease with stage 1 through stage 4 chronic kidney disease, or unspecified chronic kidney disease: Secondary | ICD-10-CM | POA: Insufficient documentation

## 2013-02-25 DIAGNOSIS — E669 Obesity, unspecified: Secondary | ICD-10-CM | POA: Insufficient documentation

## 2013-02-25 DIAGNOSIS — Z8679 Personal history of other diseases of the circulatory system: Secondary | ICD-10-CM | POA: Insufficient documentation

## 2013-02-25 DIAGNOSIS — E7800 Pure hypercholesterolemia, unspecified: Secondary | ICD-10-CM | POA: Insufficient documentation

## 2013-02-25 DIAGNOSIS — N183 Chronic kidney disease, stage 3 unspecified: Secondary | ICD-10-CM | POA: Insufficient documentation

## 2013-02-25 DIAGNOSIS — I2510 Atherosclerotic heart disease of native coronary artery without angina pectoris: Secondary | ICD-10-CM | POA: Insufficient documentation

## 2013-02-25 DIAGNOSIS — G4733 Obstructive sleep apnea (adult) (pediatric): Secondary | ICD-10-CM | POA: Insufficient documentation

## 2013-02-25 DIAGNOSIS — R059 Cough, unspecified: Secondary | ICD-10-CM | POA: Insufficient documentation

## 2013-02-25 DIAGNOSIS — J029 Acute pharyngitis, unspecified: Secondary | ICD-10-CM | POA: Insufficient documentation

## 2013-02-25 DIAGNOSIS — J3489 Other specified disorders of nose and nasal sinuses: Secondary | ICD-10-CM | POA: Insufficient documentation

## 2013-02-25 DIAGNOSIS — Z794 Long term (current) use of insulin: Secondary | ICD-10-CM | POA: Insufficient documentation

## 2013-02-25 LAB — CBC WITH DIFFERENTIAL/PLATELET
Basophils Absolute: 0 10*3/uL (ref 0.0–0.1)
HCT: 33.9 % — ABNORMAL LOW (ref 36.0–46.0)
Hemoglobin: 11 g/dL — ABNORMAL LOW (ref 12.0–15.0)
Lymphocytes Relative: 21 % (ref 12–46)
Monocytes Absolute: 0.5 10*3/uL (ref 0.1–1.0)
Neutro Abs: 5.4 10*3/uL (ref 1.7–7.7)
RDW: 13 % (ref 11.5–15.5)
WBC: 7.7 10*3/uL (ref 4.0–10.5)

## 2013-02-25 MED ORDER — ONDANSETRON HCL 4 MG/2ML IJ SOLN: 4.0000 mg | Freq: Once | INTRAMUSCULAR | Status: DC

## 2013-02-25 MED ORDER — SODIUM CHLORIDE 0.9 % IV BOLUS (SEPSIS): 1000.0000 mL | Freq: Once | INTRAVENOUS | Status: DC

## 2013-02-25 NOTE — ED Notes (Signed)
Patient presents today with cough and sore throat. Stated that it started about 7 days ago and has gotten progressively worse. Patient's daughter and and grandson were sick but they did not have sore throat. Patient also states that she is achy all over with chills. Not coughing up anything until now when I swabbed her throat. Appears to be yellow sputum.

## 2013-02-25 NOTE — ED Provider Notes (Signed)
Medical screening examination/treatment/procedure(s) were performed by non-physician practitioner and as supervising physician I was immediately available for consultation/collaboration.   Dione Booze, MD 02/25/13 [phone]

## 2013-02-25 NOTE — ED Provider Notes (Signed)
History     CSN: [id number]  Arrival date & time 02/25/13  0915   First MD Initiated Contact with Patient 02/25/13 [phone]      Chief Complaint  Patient presents with  . Sore Throat    (Consider location/radiation/quality/duration/timing/severity/associated sxs/prior treatment) HPI Comments: 57 y.o. Female with PMHx of CKD (stage 3), HLD, HTN, DM, obstructive sleep apnea, and mild Nonobstructive CAD (by cath Aug '10) presents today with sore throat that started over a week ago, gradually worsening. Hurts more when she swallows. Pt states in the the last few days, a wet cough has developed as well. Worse at night. Pt admits rhinorrhea,chills. Denies fever, nausea, vomiting, abd pain, shortness of breath, dyspnea. Pt has taken a variety of OTC cold meds with no relief (Aleve, Alka Seltzer Plus) Pt states she has been around family members who were sick with similar cold-like symptoms (runny nose and cough), but who did not have a sore throat.   Pt states that she goes to the [place] for her primary health care needs.     Patient is a 57 y.o. female presenting with pharyngitis.  Sore Throat Associated symptoms include chills, congestion and a sore throat. Pertinent negatives include no abdominal pain, chest pain, diaphoresis, fever, headaches, nausea, neck pain, numbness, rash, vomiting or weakness.    Past Medical History  Diagnosis Date  . Diabetes mellitus   . Hypertension   . Hypercholesteremia   . Obesity   . CKD (chronic kidney disease) stage 3, GFR 30-59 ml/min   . OSA (obstructive sleep apnea)   . Chest pain     Mild nonobstructive CAD (by cath Aug '10)    Past Surgical History  Procedure Laterality Date  . Tonsillectomy    . Nose surgery      No family history on file.  History  Substance Use Topics  . Smoking status: Never Smoker   . Smokeless tobacco: Not on file  . Alcohol Use: No    OB History   Grav Para Term Preterm Abortions TAB SAB Ect Mult Living           Review of Systems  Constitutional: Positive for chills. Negative for fever and diaphoresis.  HENT: Positive for congestion, sore throat and rhinorrhea. Negative for ear pain, trouble swallowing, neck pain, neck stiffness and sinus pressure.   Eyes: Negative for visual disturbance.  Respiratory: Negative for apnea, chest tightness, shortness of breath and wheezing.   Cardiovascular: Negative for chest pain and palpitations.  Gastrointestinal: Negative for nausea, vomiting, abdominal pain, diarrhea and constipation.  Genitourinary: Negative for dysuria.  Musculoskeletal: Negative for gait problem.  Skin: Negative for rash.  Neurological: Negative for dizziness, weakness, light-headedness, numbness and headaches.    Allergies  Review of patient's allergies indicates no known allergies.  Home Medications   Current Outpatient Rx  Name  Route  Sig  Dispense  Refill  . acetaminophen (TYLENOL) 325 MG tablet   Oral   Take 650 mg by mouth every 6 (six) hours as needed. Pain or headache          . aspirin EC 81 MG tablet   Oral   Take 81 mg by mouth daily.           . Calcium Carbonate-Vitamin D (CALCIUM 600+D) 600-400 MG-UNIT per tablet   Oral   Take 2 tablets by mouth daily.           . ferrous sulfate 325 (65 FE) MG tablet   Oral  Take 325 mg by mouth 2 (two) times daily.           . hydrochlorothiazide (HYDRODIURIL) 25 MG tablet   Oral   Take 25 mg by mouth daily.           . insulin aspart (NOVOLOG) 100 UNIT/ML injection   Subcutaneous   Inject 10-20 Units into the skin 3 (three) times daily before meals. Takes 10 at breakfast and lunch Takes 20 at supper          . insulin glargine (LANTUS) 100 UNIT/ML injection   Subcutaneous   Inject 50 Units into the skin at bedtime.           Marland Kitchen labetalol (NORMODYNE) 200 MG tablet   Oral   Take 200 mg by mouth 2 (two) times daily.           Marland Kitchen lisinopril (PRINIVIL,ZESTRIL) 40 MG tablet   Oral   Take 40  mg by mouth daily.           Marland Kitchen loratadine (CLARITIN) 10 MG tablet   Oral   Take 10 mg by mouth daily. allergies          . omeprazole (PRILOSEC) 20 MG capsule   Oral   Take 20 mg by mouth daily.           Marland Kitchen senna-docusate (SENOKOT-S) 8.6-50 MG per tablet   Oral   Take 1 tablet by mouth daily. constipation          . sertraline (ZOLOFT) 50 MG tablet   Oral   Take 50 mg by mouth 3 (three) times daily.           . simvastatin (ZOCOR) 40 MG tablet   Oral   Take 20 mg by mouth at bedtime.             BP 163/75  Temp(Src) 98.7 F (37.1 C) (Oral)  Resp 20  SpO2 97%  Physical Exam  Nursing note and vitals reviewed. Constitutional: She is oriented to person, place, and time. She appears well-developed and well-nourished. No distress.  HENT:  Head: Normocephalic and atraumatic. No trismus in the jaw.  Right Ear: Tympanic membrane normal.  Left Ear: Tympanic membrane normal.  Nose: Rhinorrhea present.  Mouth/Throat: Posterior oropharyngeal erythema present. No oropharyngeal exudate or posterior oropharyngeal edema.  Eyes: Conjunctivae and EOM are normal. Right eye exhibits no discharge. Left eye exhibits no discharge.  Neck: Normal range of motion. Neck supple.  No meningeal signs  Cardiovascular: Normal rate, regular rhythm, normal heart sounds and intact distal pulses.  Exam reveals no gallop and no friction rub.   No murmur heard. Pulmonary/Chest: Effort normal and breath sounds normal. No respiratory distress. She has no wheezes. She has no rales. She exhibits no tenderness.  Abdominal: Soft. Bowel sounds are normal. She exhibits no distension. There is no tenderness. There is no rebound and no guarding.  Musculoskeletal: Normal range of motion. She exhibits no edema and no tenderness.  Lymphadenopathy:    She has no cervical adenopathy.  Neurological: She is alert and oriented to person, place, and time. No cranial nerve deficit.  Skin: Skin is warm and dry.  She is not diaphoretic. No erythema.  Psychiatric: She has a normal mood and affect.    ED Course  Procedures (including critical care time)  Labs Reviewed  CBC WITH DIFFERENTIAL - Abnormal; Notable for the following:    Hemoglobin 11.0 (*)    HCT 33.9 (*)  All other components within normal limits  RAPID STREP SCREEN  CULTURE, GROUP A STREP   No results found.   1. Viral pharyngitis       MDM  Pt afebrile without tonsillar exudate, cervical lymphadenopathy, fever. Mild dysphagia and cough. Lungs CTA. Not concerning for pneumonia.  No trismus or uvula deviation. Presentation non concerning for PTA or infxn spread to soft tissue. Rapid strep negative. Pt does not appear dehydrated, but did discuss importance of water rehydration. Recommended PCP follow up.Discussed reasons to seek immediate care. Patient expresses understanding and agrees with plan.    Glade Nurse, PA-C 02/25/13 1558

## 2013-02-27 LAB — CULTURE, GROUP A STREP

## 2013-10-19 ENCOUNTER — Observation Stay (HOSPITAL_COMMUNITY)
Admission: EM | Admit: 2013-10-19 | Discharge: 2013-10-20 | Disposition: A | Discharge status: Home / Self Care | Payer: Non-veteran care | Attending: Internal Medicine | Admitting: Internal Medicine

## 2013-10-19 ENCOUNTER — Other Ambulatory Visit: Payer: Self-pay | Admitting: Cardiology

## 2013-10-19 ENCOUNTER — Emergency Department (HOSPITAL_COMMUNITY): Payer: Non-veteran care

## 2013-10-19 ENCOUNTER — Encounter (HOSPITAL_COMMUNITY): Payer: Self-pay | Admitting: Emergency Medicine

## 2013-10-19 DIAGNOSIS — D649 Anemia, unspecified: Secondary | ICD-10-CM | POA: Diagnosis present

## 2013-10-19 DIAGNOSIS — I2510 Atherosclerotic heart disease of native coronary artery without angina pectoris: Secondary | ICD-10-CM | POA: Insufficient documentation

## 2013-10-19 DIAGNOSIS — F32A Depression, unspecified: Secondary | ICD-10-CM | POA: Diagnosis present

## 2013-10-19 DIAGNOSIS — N183 Chronic kidney disease, stage 3 unspecified: Secondary | ICD-10-CM | POA: Diagnosis present

## 2013-10-19 DIAGNOSIS — K219 Gastro-esophageal reflux disease without esophagitis: Secondary | ICD-10-CM | POA: Diagnosis present

## 2013-10-19 DIAGNOSIS — J42 Unspecified chronic bronchitis: Secondary | ICD-10-CM | POA: Diagnosis present

## 2013-10-19 DIAGNOSIS — F329 Major depressive disorder, single episode, unspecified: Secondary | ICD-10-CM | POA: Insufficient documentation

## 2013-10-19 DIAGNOSIS — M7989 Other specified soft tissue disorders: Secondary | ICD-10-CM | POA: Insufficient documentation

## 2013-10-19 DIAGNOSIS — E876 Hypokalemia: Secondary | ICD-10-CM | POA: Diagnosis present

## 2013-10-19 DIAGNOSIS — R059 Cough, unspecified: Secondary | ICD-10-CM | POA: Diagnosis present

## 2013-10-19 DIAGNOSIS — E119 Type 2 diabetes mellitus without complications: Secondary | ICD-10-CM | POA: Diagnosis present

## 2013-10-19 DIAGNOSIS — R079 Chest pain, unspecified: Principal | ICD-10-CM | POA: Diagnosis present

## 2013-10-19 DIAGNOSIS — R05 Cough: Secondary | ICD-10-CM

## 2013-10-19 DIAGNOSIS — Z794 Long term (current) use of insulin: Secondary | ICD-10-CM | POA: Insufficient documentation

## 2013-10-19 DIAGNOSIS — I10 Essential (primary) hypertension: Secondary | ICD-10-CM | POA: Diagnosis present

## 2013-10-19 DIAGNOSIS — F3289 Other specified depressive episodes: Secondary | ICD-10-CM | POA: Insufficient documentation

## 2013-10-19 DIAGNOSIS — Z79899 Other long term (current) drug therapy: Secondary | ICD-10-CM | POA: Insufficient documentation

## 2013-10-19 DIAGNOSIS — Z8249 Family history of ischemic heart disease and other diseases of the circulatory system: Secondary | ICD-10-CM | POA: Insufficient documentation

## 2013-10-19 DIAGNOSIS — E785 Hyperlipidemia, unspecified: Secondary | ICD-10-CM | POA: Diagnosis present

## 2013-10-19 DIAGNOSIS — G4733 Obstructive sleep apnea (adult) (pediatric): Secondary | ICD-10-CM | POA: Diagnosis present

## 2013-10-19 DIAGNOSIS — E7800 Pure hypercholesterolemia, unspecified: Secondary | ICD-10-CM | POA: Insufficient documentation

## 2013-10-19 DIAGNOSIS — E669 Obesity, unspecified: Secondary | ICD-10-CM | POA: Diagnosis present

## 2013-10-19 DIAGNOSIS — I129 Hypertensive chronic kidney disease with stage 1 through stage 4 chronic kidney disease, or unspecified chronic kidney disease: Secondary | ICD-10-CM | POA: Insufficient documentation

## 2013-10-19 DIAGNOSIS — Z7982 Long term (current) use of aspirin: Secondary | ICD-10-CM | POA: Insufficient documentation

## 2013-10-19 HISTORY — DX: Major depressive disorder, single episode, unspecified: F32.9

## 2013-10-19 HISTORY — DX: Gastro-esophageal reflux disease without esophagitis: K21.9

## 2013-10-19 HISTORY — DX: Depression, unspecified: F32.A

## 2013-10-19 HISTORY — DX: Anemia, unspecified: D64.9

## 2013-10-19 HISTORY — DX: Atherosclerotic heart disease of native coronary artery without angina pectoris: I25.10

## 2013-10-19 LAB — HEPATIC FUNCTION PANEL
ALBUMIN: 3.6 g/dL (ref 3.5–5.2)
ALT: 33 U/L (ref 0–35)
AST: 46 U/L — AB (ref 0–37)
Alkaline Phosphatase: 158 U/L — ABNORMAL HIGH (ref 39–117)
TOTAL PROTEIN: 7.7 g/dL (ref 6.0–8.3)
Total Bilirubin: 0.2 mg/dL — ABNORMAL LOW (ref 0.3–1.2)

## 2013-10-19 LAB — BASIC METABOLIC PANEL
BUN: 15 mg/dL (ref 6–23)
CHLORIDE: 98 meq/L (ref 96–112)
CO2: 28 meq/L (ref 19–32)
CREATININE: 1.48 mg/dL — AB (ref 0.50–1.10)
Calcium: 9.6 mg/dL (ref 8.4–10.5)
GFR calc Af Amer: 44 mL/min — ABNORMAL LOW (ref >90)
GFR calc non Af Amer: 38 mL/min — ABNORMAL LOW (ref >90)
GLUCOSE: 107 mg/dL — AB (ref 70–99)
Potassium: 3.4 mEq/L — ABNORMAL LOW (ref 3.7–5.3)
Sodium: 137 mEq/L (ref 137–147)

## 2013-10-19 LAB — CBC WITH DIFFERENTIAL/PLATELET
Basophils Absolute: 0 10*3/uL (ref 0.0–0.1)
Basophils Relative: 0 % (ref 0–1)
EOS PCT: 2 % (ref 0–5)
Eosinophils Absolute: 0.2 10*3/uL (ref 0.0–0.7)
HEMATOCRIT: 34.9 % — AB (ref 36.0–46.0)
HEMOGLOBIN: 11.2 g/dL — AB (ref 12.0–15.0)
LYMPHS PCT: 31 % (ref 12–46)
Lymphs Abs: 3.4 10*3/uL (ref 0.7–4.0)
MCH: 26.1 pg (ref 26.0–34.0)
MCHC: 32.1 g/dL (ref 30.0–36.0)
MCV: 81.4 fL (ref 78.0–100.0)
MONO ABS: 0.6 10*3/uL (ref 0.1–1.0)
MONOS PCT: 5 % (ref 3–12)
NEUTROS ABS: 6.7 10*3/uL (ref 1.7–7.7)
Neutrophils Relative %: 61 % (ref 43–77)
Platelets: 289 10*3/uL (ref 150–400)
RBC: 4.29 MIL/uL (ref 3.87–5.11)
RDW: 13.2 % (ref 11.5–15.5)
WBC: 10.9 10*3/uL — AB (ref 4.0–10.5)

## 2013-10-19 LAB — TROPONIN I
Troponin I: <0.3 ng/mL (ref <0.30)
Troponin I: <0.3 ng/mL (ref <0.30)

## 2013-10-19 LAB — GLUCOSE, CAPILLARY
GLUCOSE-CAPILLARY: 161 mg/dL — AB (ref 70–99)
GLUCOSE-CAPILLARY: 246 mg/dL — AB (ref 70–99)
Glucose-Capillary: 176 mg/dL — ABNORMAL HIGH (ref 70–99)

## 2013-10-19 LAB — PRO B NATRIURETIC PEPTIDE: Pro B Natriuretic peptide (BNP): 43.8 pg/mL (ref 0–125)

## 2013-10-19 MED ORDER — LISINOPRIL 40 MG PO TABS
40.0000 mg | ORAL_TABLET | Freq: Every day | ORAL | Status: DC
  Administered 2013-10-19 – 2013-10-20 (×2): 40 mg via ORAL
  Filled 2013-10-19 (×2): qty 1

## 2013-10-19 MED ORDER — HYDROCOD POLST-CHLORPHEN POLST 10-8 MG/5ML PO LQCR
5.0000 mL | Freq: Two times a day (BID) | ORAL | Status: DC
  Administered 2013-10-19 – 2013-10-20 (×3): 5 mL via ORAL
  Filled 2013-10-19 (×3): qty 5

## 2013-10-19 MED ORDER — MORPHINE SULFATE 4 MG/ML IJ SOLN
4.0000 mg | INTRAMUSCULAR | Status: DC | PRN
  Administered 2013-10-19 (×2): 4 mg via INTRAVENOUS
  Filled 2013-10-19 (×2): qty 1

## 2013-10-19 MED ORDER — PANTOPRAZOLE SODIUM 40 MG PO TBEC
40.0000 mg | DELAYED_RELEASE_TABLET | Freq: Every day | ORAL | Status: DC
  Administered 2013-10-19 – 2013-10-20 (×2): 40 mg via ORAL
  Filled 2013-10-19 (×2): qty 1

## 2013-10-19 MED ORDER — ACETAMINOPHEN 325 MG PO TABS: 650.0000 mg | ORAL_TABLET | ORAL | Status: DC | PRN

## 2013-10-19 MED ORDER — ENOXAPARIN SODIUM 40 MG/0.4ML ~~LOC~~ SOLN
40.0000 mg | SUBCUTANEOUS | Status: DC
  Administered 2013-10-19 – 2013-10-20 (×2): 40 mg via SUBCUTANEOUS
  Filled 2013-10-19 (×2): qty 0.4

## 2013-10-19 MED ORDER — GI COCKTAIL ~~LOC~~
30.0000 mL | Freq: Four times a day (QID) | ORAL | Status: DC | PRN
  Administered 2013-10-19: 30 mL via ORAL
  Filled 2013-10-19: qty 30

## 2013-10-19 MED ORDER — ASPIRIN 81 MG PO CHEW
324.0000 mg | CHEWABLE_TABLET | Freq: Once | ORAL | Status: AC
  Administered 2013-10-19: 324 mg via ORAL
  Filled 2013-10-19: qty 4

## 2013-10-19 MED ORDER — INSULIN ASPART 100 UNIT/ML ~~LOC~~ SOLN
0.0000 [IU] | Freq: Every day | SUBCUTANEOUS | Status: DC
  Administered 2013-10-19: 23:00:00 2 [IU] via SUBCUTANEOUS

## 2013-10-19 MED ORDER — HYDROCOD POLST-CHLORPHEN POLST 10-8 MG/5ML PO LQCR
5.0000 mL | Freq: Once | ORAL | Status: AC
  Administered 2013-10-19: 5 mL via ORAL
  Filled 2013-10-19: qty 5

## 2013-10-19 MED ORDER — LORATADINE 10 MG PO TABS
10.0000 mg | ORAL_TABLET | Freq: Every day | ORAL | Status: DC
  Administered 2013-10-19 – 2013-10-20 (×2): 10 mg via ORAL
  Filled 2013-10-19 (×2): qty 1

## 2013-10-19 MED ORDER — INSULIN GLARGINE 100 UNIT/ML ~~LOC~~ SOLN
30.0000 [IU] | Freq: Every morning | SUBCUTANEOUS | Status: DC
  Administered 2013-10-19 – 2013-10-20 (×2): 30 [IU] via SUBCUTANEOUS
  Filled 2013-10-19 (×2): qty 0.3

## 2013-10-19 MED ORDER — CALCIUM CARBONATE-VITAMIN D 600-400 MG-UNIT PO TABS: 2.0000 | ORAL_TABLET | Freq: Every day | ORAL | Status: DC

## 2013-10-19 MED ORDER — INSULIN ASPART 100 UNIT/ML ~~LOC~~ SOLN
0.0000 [IU] | Freq: Three times a day (TID) | SUBCUTANEOUS | Status: DC
  Administered 2013-10-19 (×2): 4 [IU] via SUBCUTANEOUS
  Administered 2013-10-20: 08:00:00 via SUBCUTANEOUS

## 2013-10-19 MED ORDER — INSULIN GLARGINE 100 UNIT/ML ~~LOC~~ SOLN
36.0000 [IU] | Freq: Every day | SUBCUTANEOUS | Status: DC
  Administered 2013-10-19: 36 [IU] via SUBCUTANEOUS
  Filled 2013-10-19 (×2): qty 0.36

## 2013-10-19 MED ORDER — POTASSIUM CHLORIDE CRYS ER 20 MEQ PO TBCR
20.0000 meq | EXTENDED_RELEASE_TABLET | Freq: Every day | ORAL | Status: DC
  Administered 2013-10-19 – 2013-10-20 (×2): 20 meq via ORAL
  Filled 2013-10-19 (×2): qty 1

## 2013-10-19 MED ORDER — ASPIRIN EC 81 MG PO TBEC
81.0000 mg | DELAYED_RELEASE_TABLET | Freq: Every day | ORAL | Status: DC
  Administered 2013-10-19 – 2013-10-20 (×2): 81 mg via ORAL
  Filled 2013-10-19 (×2): qty 1

## 2013-10-19 MED ORDER — NITROGLYCERIN 0.4 MG SL SUBL
0.4000 mg | SUBLINGUAL_TABLET | SUBLINGUAL | Status: DC | PRN
  Administered 2013-10-19 (×2): 0.4 mg via SUBLINGUAL
  Filled 2013-10-19: qty 25

## 2013-10-19 MED ORDER — NITROGLYCERIN 0.3 MG SL SUBL: 0.3000 mg | SUBLINGUAL_TABLET | SUBLINGUAL | Status: DC | PRN

## 2013-10-19 MED ORDER — MORPHINE SULFATE 4 MG/ML IJ SOLN
4.0000 mg | Freq: Once | INTRAMUSCULAR | Status: AC
  Administered 2013-10-19: 4 mg via INTRAVENOUS
  Filled 2013-10-19: qty 1

## 2013-10-19 MED ORDER — ALBUTEROL SULFATE HFA 108 (90 BASE) MCG/ACT IN AERS
1.0000 | INHALATION_SPRAY | RESPIRATORY_TRACT | Status: DC | PRN
  Administered 2013-10-19: 2 via RESPIRATORY_TRACT
  Filled 2013-10-19: qty 6.7

## 2013-10-19 MED ORDER — HYDROCHLOROTHIAZIDE 25 MG PO TABS
25.0000 mg | ORAL_TABLET | Freq: Every day | ORAL | Status: DC
  Administered 2013-10-19 – 2013-10-20 (×2): 25 mg via ORAL
  Filled 2013-10-19 (×2): qty 1

## 2013-10-19 MED ORDER — ONDANSETRON HCL 4 MG/2ML IJ SOLN: 4.0000 mg | Freq: Four times a day (QID) | INTRAMUSCULAR | Status: DC | PRN

## 2013-10-19 MED ORDER — CARVEDILOL 3.125 MG PO TABS
3.1250 mg | ORAL_TABLET | Freq: Two times a day (BID) | ORAL | Status: DC
  Administered 2013-10-19 – 2013-10-20 (×2): 3.125 mg via ORAL
  Filled 2013-10-19 (×4): qty 1

## 2013-10-19 MED ORDER — SIMVASTATIN 20 MG PO TABS
20.0000 mg | ORAL_TABLET | Freq: Every day | ORAL | Status: DC
  Administered 2013-10-19: 23:00:00 20 mg via ORAL
  Filled 2013-10-19 (×2): qty 1

## 2013-10-19 MED ORDER — SERTRALINE HCL 50 MG PO TABS
50.0000 mg | ORAL_TABLET | Freq: Three times a day (TID) | ORAL | Status: DC
  Administered 2013-10-19 – 2013-10-20 (×4): 50 mg via ORAL
  Filled 2013-10-19 (×6): qty 1

## 2013-10-19 MED ORDER — CALCIUM CARBONATE-VITAMIN D 500-200 MG-UNIT PO TABS
2.0000 | ORAL_TABLET | Freq: Every day | ORAL | Status: DC
  Administered 2013-10-19 – 2013-10-20 (×2): 2 via ORAL
  Filled 2013-10-19 (×2): qty 2

## 2013-10-19 NOTE — Progress Notes (Signed)
Report received from [place], [place] in [place].

## 2013-10-19 NOTE — Progress Notes (Signed)
Pt received from ED on stretcher. Amb self to bed w/ steady gait. Placed on tele box 34, confirmed w/ CMT. Pt oriented to callbell and environment. POC discussed. VSS. Pt reports CP improved to 2/10 after received morphine in ED prior to transfer to floor.

## 2013-10-19 NOTE — Progress Notes (Signed)
Report received from G.Stevens,RN. No change in assessment. Will continue plan of care. Jacquelinne Speak Johnson 

## 2013-10-19 NOTE — ED Notes (Signed)
Pt states she started having mild chest pain about 2 am this morning  Pt states it has gotten worse  Pt states pain is in her left chest and describes as throbbing in nature  Pt states on the way here she started feeling dizzy and having some nausea

## 2013-10-19 NOTE — Consult Note (Signed)
Reason for Consult: chest pain   Referring Physician: Dr. Rockne Menghini XEN:[facility] Primary Cardiologist:Has seen Dr. Angelena Form in the past  Tara Patel is an 58 y.o. female.    Chief Complaint: admitted 10/19/13 with chest pain  HPI: 58 y.o. female with a PMH of DM, HTN, HLD, obesity and stage III CKD, who presents with the gradual onset of left sided chest pain that began at 2:00 a.m. When the patient was at rest. Pain was rated 8/10 and described as a pressure like sensation. No radiation of pain at the time, but is now complaining that her left arm feels numb. Pain was persistent for about 2 hours, at which time her nephew brought her to the ED for evaluation. Pain has improved to a level 4/10 after receiving ASA and SL NTG x 2. Though she did not believe the NTG helped.  She had some nausea and dizziness en route to the hospital, but no diaphoresis. The patient had a cardiac catheterization done 05/23/2009 by Dr. Lauree Chandler which showed mild nonobstructive coronary artery disease and normal left ventricular systolic function. She does have significant coronary artery disease risk factors including a positive family history, a history of diabetes, hypertension and hyperlipidemia.  She has also been admitted 2011 with chest pain and in 2013 all negative for MI.  Troponin i negative X 2, Pro BNP low.  EKG SR with 1st degree AV block otherwise no acute changes from 2013.  Currently her pain comes and goes with movement in bed or cough.   Past Medical History  Diagnosis Date  . Diabetes mellitus   . Hypertension   . Hypercholesteremia   . Obesity   . CKD (chronic kidney disease) stage 3, GFR 30-59 ml/min   . OSA (obstructive sleep apnea)     CPAP Q HS  . Chest pain     Mild nonobstructive CAD (by cath Aug '10)  . Depression   . GERD (gastroesophageal reflux disease)   . Coronary artery disease     Mild, nonobstructive by catheterization 05/23/2009   . Normocytic anemia 10/19/2013    Past Surgical History  Procedure Laterality Date  . Tonsillectomy    . Nasal septum surgery    . Tubal ligation    . Abdominal hysterectomy    . Adenoidectomy      Family History  Problem Relation Age of Onset  . CAD Mother     s/p triple bypass  . Cancer Other   . Diabetes Other   . Diabetes Mother   . Hypertension Mother   . Cancer Sister     Colon  . Cancer Brother     Colon  . Diabetes Brother    Social History:  reports that she has never smoked. She has never used smokeless tobacco. She reports that she drinks alcohol. She reports that she does not use illicit drugs. She does not exercise.  Her mother died this past October 10, 2023. In her 53s.  She did not have premature CAD.  Allergies: No Known Allergies  Medications Prior to Admission  Medication Sig Dispense Refill  . aspirin EC 81 MG tablet Take 81 mg by mouth daily.        . Calcium Carbonate-Vitamin D (CALCIUM 600+D) 600-400 MG-UNIT per tablet Take 2 tablets by mouth daily.        . carvedilol (COREG) 3.125 MG tablet Take 3.125 mg by mouth 2 (two) times daily with a  meal.      . hydrochlorothiazide (HYDRODIURIL) 25 MG tablet Take 25 mg by mouth daily.        . insulin aspart (NOVOLOG) 100 UNIT/ML injection Inject 16-26 Units into the skin 2 (two) times daily. Takes 26 at breakfast and lunch Takes 16 at supper      . insulin glargine (LANTUS) 100 UNIT/ML injection Inject 30-36 Units into the skin 2 (two) times daily. Takes 30 units in the morning and 36 units at bedtime      . lisinopril (PRINIVIL,ZESTRIL) 40 MG tablet Take 40 mg by mouth daily.        Marland Kitchen loratadine (CLARITIN) 10 MG tablet Take 10 mg by mouth daily. allergies       . omeprazole (PRILOSEC) 20 MG capsule Take 20 mg by mouth daily.        . sertraline (ZOLOFT) 50 MG tablet Take 50 mg by mouth 3 (three) times daily.        . simvastatin (ZOCOR) 40 MG tablet Take 20 mg by mouth at bedtime.          Results for orders placed  during the hospital encounter of 10/19/13 (from the past 48 hour(s))  CBC WITH DIFFERENTIAL     Status: Abnormal   Collection Time    10/19/13  5:30 AM      Result Value Range   WBC 10.9 (*) 4.0 - 10.5 K/uL   RBC 4.29  3.87 - 5.11 MIL/uL   Hemoglobin 11.2 (*) 12.0 - 15.0 g/dL   HCT 34.9 (*) 36.0 - 46.0 %   MCV 81.4  78.0 - 100.0 fL   MCH 26.1  26.0 - 34.0 pg   MCHC 32.1  30.0 - 36.0 g/dL   RDW 13.2  11.5 - 15.5 %   Platelets 289  150 - 400 K/uL   Neutrophils Relative % 61  43 - 77 %   Neutro Abs 6.7  1.7 - 7.7 K/uL   Lymphocytes Relative 31  12 - 46 %   Lymphs Abs 3.4  0.7 - 4.0 K/uL   Monocytes Relative 5  3 - 12 %   Monocytes Absolute 0.6  0.1 - 1.0 K/uL   Eosinophils Relative 2  0 - 5 %   Eosinophils Absolute 0.2  0.0 - 0.7 K/uL   Basophils Relative 0  0 - 1 %   Basophils Absolute 0.0  0.0 - 0.1 K/uL  BASIC METABOLIC PANEL     Status: Abnormal   Collection Time    10/19/13  5:30 AM      Result Value Range   Sodium 137  137 - 147 mEq/L   Potassium 3.4 (*) 3.7 - 5.3 mEq/L   Chloride 98  96 - 112 mEq/L   CO2 28  19 - 32 mEq/L   Glucose, Bld 107 (*) 70 - 99 mg/dL   BUN 15  6 - 23 mg/dL   Creatinine, Ser 1.48 (*) 0.50 - 1.10 mg/dL   Calcium 9.6  8.4 - 10.5 mg/dL   GFR calc non Af Amer 38 (*) >90 mL/min   GFR calc Af Amer 44 (*) >90 mL/min   Comment: (NOTE)     The eGFR has been calculated using the CKD EPI equation.     This calculation has not been validated in all clinical situations.     eGFR's persistently <90 mL/min signify possible Chronic Kidney     Disease.  TROPONIN I     Status: None  Collection Time    10/19/13  5:30 AM      Result Value Range   Troponin I <0.30  <0.30 ng/mL   Comment:            Due to the release kinetics of cTnI,     a negative result within the first hours     of the onset of symptoms does not rule out     myocardial infarction with certainty.     If myocardial infarction is still suspected,     repeat the test at appropriate  intervals.  PRO B NATRIURETIC PEPTIDE     Status: None   Collection Time    10/19/13  5:30 AM      Result Value Range   Pro B Natriuretic peptide (BNP) 43.8  0 - 125 pg/mL  HEPATIC FUNCTION PANEL     Status: Abnormal (Preliminary result)   Collection Time    10/19/13  5:30 AM      Result Value Range   Total Protein 7.7  6.0 - 8.3 g/dL   Albumin 3.6  3.5 - 5.2 g/dL   AST 46 (*) 0 - 37 U/L   ALT 33  0 - 35 U/L   Alkaline Phosphatase 158 (*) 39 - 117 U/L   Total Bilirubin 0.2 (*) 0.3 - 1.2 mg/dL   Bilirubin, Direct <0.2  0.0 - 0.3 mg/dL   Indirect Bilirubin PENDING  0.3 - 0.9 mg/dL  TROPONIN I     Status: None   Collection Time    10/19/13 11:45 AM      Result Value Range   Troponin I <0.30  <0.30 ng/mL   Comment:            Due to the release kinetics of cTnI,     a negative result within the first hours     of the onset of symptoms does not rule out     myocardial infarction with certainty.     If myocardial infarction is still suspected,     repeat the test at appropriate intervals.   Dg Chest 2 View  10/19/2013   CLINICAL DATA:  Sudden onset chest pain  EXAM: CHEST  2 VIEW  COMPARISON:  10/08/2011  FINDINGS: Generous heart size, stable from prior. Chronic bronchitic changes. No edema or consolidation. No effusion or pneumothorax.  IMPRESSION: No evidence of acute cardiopulmonary disease.   Electronically Signed   By: Jorje Guild M.D.   On: 10/19/2013 06:40    ROS: General:mild cold with cough no fevers, no weight changes Skin:no rashes or ulcers HEENT:no blurred vision, no congestion CV:see HPI PUL:see HPI, OSA she does wear her cpap at night GI:no diarrhea constipation or melena, no indigestion GU:no hematuria, no dysuria MS:no joint pain, no claudication, occ rt leg swelling though they both swell at times. Neuro:no syncope, no lightheadedness Endo:+ diabetes mostly well controlled, no thyroid disease   Blood pressure 130/66, pulse 86, temperature 98 F (36.7 C),  temperature source Oral, resp. rate 16, height 5' 7" (1.702 m), weight 235 lb 7.2 oz (106.8 kg), SpO2 99.00%. PE: General:Pleasant affect, NAD Skin:Warm and dry, brisk capillary refill HEENT:normocephalic, sclera clear, mucus membranes moist Neck:supple, no JVD, no bruits  Heart:S1S2 RRR without murmur, gallup, rub or click Lungs:clear without rales, rhonchi, or wheezes JQG:BEEFE, soft, non tender, + BS, do not palpate liver spleen or masses Ext:no lower ext edema, 2+ pedal pulses, 2+ radial pulses Neuro:alert and oriented, MAE, follows commands, + facial  symmetry   EKG;   EKG is normal.  Telemetry    Telemetry reveals normal sinus rhythm and  Assessment/Plan    Chest pain with moderate risk for cardiac etiology     The patient underwent cardiac catheterization in 2010. She had a 30% LAD lesion at that time. With her current admission there is no EKG change. Troponins are normal. She does have some discomfort with movement in bed. So far there is no proof of a definite ischemic etiology. The plan will be to continue to follow her third troponin. If it is negative and her other medical problems are stable, she could be discharged home from the cardiology viewpoint. Plans can be made for an outpatient Lexiscan Myoview at our [facility] office. She should also have a post hospital visit with Dr. Angelena Form or our extender team.         Diabetes mellitus, type 2   Hypertension   Hyperlipidemia   Obesity   Obstructive sleep apnea   CKD (chronic kidney disease) stage 3, GFR 30-59 ml/min   Hypokalemia   Cough   Unspecified chronic bronchitis   Depression   GERD (gastroesophageal reflux disease)   Normocytic anemia    [facility]  Nurse Practitioner Certified [place] Pager [phone] or after 5pm or weekends call [phone] 10/19/2013, 12:49 PM Patient seen and examined. I agree with the assessment and plan as detailed above. See also my additional thoughts  below.   I have personally modified the note above in May the assessment and plan. If the third enzyme is normal, the plan will be for an outpatient nuclear study in followup in our office.  Dola Argyle, MD, [facility] 10/19/2013 1:33 PM

## 2013-10-19 NOTE — H&P (Addendum)
[facility] History and Physical  Tara Patel WUJ:[id number] DOB: [birth year] DOA: 10/19/2013  Referring physician: Dr. Marisa Severin PCP: No primary provider on file. Goes to [facility] Complaint: Chest pain   History of Present Illness: Tara Patel is an 58 y.o. female with a PMH of DM, HTN, HLD, obesity and stage III CKD, who presents with the gradual onset of left sided chest pain that began at 2:00 a.m. When the patient was at rest.  Pain was rated 8/10 and described as a pressure like sensation.  No radiation of pain at the time, but is now complaining that her left arm feels numb.  Pain was persistent for about 2 hours, at which time her nephew brought her to the ED for evaluation.  Pain has improved to a level 4/10 after receiving ASA and SL NTG x 2.  Had some nausea and dizziness en route to the hospital, but no diaphoresis.  The patient had a cardiac catheterization done 05/23/2009 by Dr. Verne Carrow which showed mild nonobstructive coronary artery disease and normal left ventricular systolic function. She does have significant coronary artery disease risk factors including a positive family history, a history of diabetes, hypertension and hyperlipidemia.  Review of Systems: Constitutional: No fever, no chills;  Appetite normal; No weight loss, + weight gain, + fatigue.  HEENT: No blurry vision, no diplopia, no pharyngitis, no dysphagia CV: + chest pain, no palpitations, no PND.  Resp: No SOB, + cough x 1 month with yellow sputum, + pleuritic pain. GI: No nausea, no vomiting, no diarrhea, no melena, no hematochezia, no constipation.  GU: No dysuria, no hematuria, + frequency, no urgency. MSK: no myalgias, no arthralgias.  Neuro:  No headache, no focal neurological deficits, no history of seizures.  Psych: + depression, no anxiety, +insomnia.  Endo: No heat intolerance, no cold intolerance, + polyuria, no polydipsia, has not been checking sugars regularly  Skin: No  rashes, no skin lesions.  Heme: No easy bruising.  Travel: history: None in last 6 months.  Past Medical History Past Medical History  Diagnosis Date  . Diabetes mellitus   . Hypertension   . Hypercholesteremia   . Obesity   . CKD (chronic kidney disease) stage 3, GFR 30-59 ml/min   . OSA (obstructive sleep apnea)     CPAP Q HS  . Chest pain     Mild nonobstructive CAD (by cath Aug '10)  . Depression   . GERD (gastroesophageal reflux disease)   . Coronary artery disease     Mild, nonobstructive by catheterization 05/23/2009     Past Surgical History Past Surgical History  Procedure Laterality Date  . Tonsillectomy    . Nasal septum surgery    . Tubal ligation    . Abdominal hysterectomy    . Adenoidectomy       Social History: History   Social History  . Marital Status: Divorced    Spouse Name: N/A    Number of Children: 2  . Years of Education: 13   Occupational History  . Unemployed.    Social History Main Topics  . Smoking status: Never Smoker   . Smokeless tobacco: Not on file  . Alcohol Use: Yes     Comment: Rare   . Drug Use: No  . Sexual Activity: Not on file   Other Topics Concern  . Not on file   Social History Narrative   Divorced.  Lives with father and 2 brothers.  [facility]    Family History:  Family History  Problem Relation Age of Onset  . CAD Mother     s/p triple bypass  . Cancer Other   . Diabetes Other   . Diabetes Mother   . Hypertension Mother   . Cancer Sister     Colon  . Cancer Brother     Colon  . Diabetes Brother     Allergies: Review of patient's allergies indicates no known allergies.  Meds: Prior to Admission medications   Medication Sig Start Date End Date Taking? Authorizing Provider  aspirin EC 81 MG tablet Take 81 mg by mouth daily.     Yes Historical Provider, MD  Calcium Carbonate-Vitamin D (CALCIUM 600+D) 600-400 MG-UNIT per tablet Take 2 tablets by mouth daily.     Yes  Historical Provider, MD  carvedilol (COREG) 3.125 MG tablet Take 3.125 mg by mouth 2 (two) times daily with a meal.   Yes Historical Provider, MD  hydrochlorothiazide (HYDRODIURIL) 25 MG tablet Take 25 mg by mouth daily.     Yes Historical Provider, MD  insulin aspart (NOVOLOG) 100 UNIT/ML injection Inject 16-26 Units into the skin 2 (two) times daily. Takes 26 at breakfast and lunch Takes 16 at supper   Yes Historical Provider, MD  insulin glargine (LANTUS) 100 UNIT/ML injection Inject 30-36 Units into the skin 2 (two) times daily. Takes 30 units in the morning and 36 units at bedtime   Yes Historical Provider, MD  lisinopril (PRINIVIL,ZESTRIL) 40 MG tablet Take 40 mg by mouth daily.     Yes Historical Provider, MD  loratadine (CLARITIN) 10 MG tablet Take 10 mg by mouth daily. allergies    Yes Historical Provider, MD  omeprazole (PRILOSEC) 20 MG capsule Take 20 mg by mouth daily.     Yes Historical Provider, MD  sertraline (ZOLOFT) 50 MG tablet Take 50 mg by mouth 3 (three) times daily.     Yes Historical Provider, MD  simvastatin (ZOCOR) 40 MG tablet Take 20 mg by mouth at bedtime.     Yes Historical Provider, MD    Physical Exam: Filed Vitals:   10/19/13 0700 10/19/13 0703 10/19/13 0730 10/19/13 0817  BP: 137/66 137/66 136/60 130/66  Pulse:  92  86  Temp:    98 F (36.7 C)  TempSrc:    Oral  Resp: 16  14 16   Height:    5\' 7"  (1.702 m)  Weight:    106.8 kg (235 lb 7.2 oz)  SpO2:    99%     Physical Exam: Blood pressure 130/66, pulse 86, temperature 98 F (36.7 C), temperature source Oral, resp. rate 16, height 5\' 7"  (1.702 m), weight 106.8 kg (235 lb 7.2 oz), SpO2 99.00%. Gen: No acute distress. Head: Normocephalic, atraumatic. Eyes: PERRL, EOMI, sclerae nonicteric. Mouth: Oropharynx clear. Moist mucous membranes. Neck: Supple, no thyromegaly, no lymphadenopathy, no jugular venous distention. Chest: Lungs clear to auscultation bilaterally. CV: Heart sounds are regular. No  murmurs, rubs, or gallops. Abdomen: Soft, nontender, nondistended with normal active bowel sounds. Extremities: Extremities with 1+ edema bilaterally. No clubbing or cyanosis. Skin: Warm and dry. Neuro: Alert and oriented times 3; cranial nerves II through XII grossly intact. Psych: Mood and affect flat.  Labs on Admission:  Basic Metabolic Panel:  Recent Labs Lab 10/19/13 0530  NA 137  K 3.4*  CL 98  CO2 28  GLUCOSE 107*  BUN 15  CREATININE 1.48*  CALCIUM 9.6  CBC:  Recent Labs Lab 10/19/13 0530  WBC 10.9*  NEUTROABS 6.7  HGB 11.2*  HCT 34.9*  MCV 81.4  PLT 289   Cardiac Enzymes:  Recent Labs Lab 10/19/13 0530  TROPONINI <0.30   Radiological Exams on Admission: Dg Chest 2 View  10/19/2013   CLINICAL DATA:  Sudden onset chest pain  EXAM: CHEST  2 VIEW  COMPARISON:  10/08/2011  FINDINGS: Generous heart size, stable from prior. Chronic bronchitic changes. No edema or consolidation. No effusion or pneumothorax.  IMPRESSION: No evidence of acute cardiopulmonary disease.   Electronically Signed   By: Tiburcio Pea M.D.   On: 10/19/2013 06:40    EKG: Independently reviewed. Normal sinus rhythm at 93 beats per minute with no ST-T wave abnormalities. Prolonged PR interval.  Assessment/Plan Principal Problem:   Chest pain with moderate risk for cardiac etiology Patient has several risk factors for coronary artery disease including diabetes, hypertension and hyperlipidemia as well as OSA. She is obese. She had a heart catheterization 05/2009 which showed mild nonobstructive coronary disease. She has been medically managed. Her chest pain has some atypical features. Will admit for observation to a telemetry floor and cycle cardiac markers q. 6 hours x3. We'll repeat 12-lead EKG in the morning. Check fasting lipid panel in the morning to ensure lipids are well-controlled. Check hemoglobin A1c to ensure diabetes is well-controlled. Will consult cardiologist for possible stress  testing. Differential includes chronic bronchitis with pleuritic pain, but with her and coronary artery disease risk factors, we'll need to rule out a cardiac source. Check pro BNP. Continue aspirin, statin, beta blocker, when necessary nitroglycerin. Active Problems:   Diabetes mellitus, type 2 We'll place on insulin resistant sliding scale and usual dose of basal insulin. Check hemoglobin A1c to evaluate glycemic control.   Hypertension Continue Coreg, HCTZ and lisinopril.   Hyperlipidemia Continue Zocor.   Obesity Carbohydrate modified diet.   Obstructive sleep apnea CPAP each bedtime per her home regimen.   CKD (chronic kidney disease) stage 3, GFR 30-59 ml/min Baseline creatinine 1.2-1.5. Current creatinine at usual baseline values. Avoid nephrotoxins.   Hypokalemia Likely related to diuretic therapy. We'll start on 20 mEq of oral potassium replacement daily.   Cough / unspecified chronic bronchitis Suppress cough with Tussionex. Could try prednisone, but this will likely cause aeration of her glycemic control.   Code Status: Full. Family Communication: Identifies her daughter Tara Patel as emergency contact [phone]) Disposition Plan: Home in 24 hours if stable.  Time spent: 1 hour.  Tara Patel [facility] Pager [phone]  If 7PM-7AM, please contact night-coverage [email or web address] Password [facility] 10/19/2013, 8:28 AM

## 2013-10-19 NOTE — Progress Notes (Signed)
UR completed 

## 2013-10-19 NOTE — ED Provider Notes (Signed)
CSN: [id number]     Arrival date & time 10/19/13  0510 History   First MD Initiated Contact with Patient 10/19/13 [phone]     Chief Complaint  Patient presents with  . Chest Pain   (Consider location/radiation/quality/duration/timing/severity/associated sxs/prior Treatment) HPI 58 year old female presents to emergency room from home with complaint of chest pain.  She reports not sleeping well.  Last night, tossing and turning.  Around 2 AM she developed sharp left upper lateral chest pain.  She reports some numbness and tingling into her left arm.  On drive here, she developed some mild dizziness and nausea.  Patient, reports she's had cough for the last month, and a half.  Causes be improving, less sputum production.  No fever no chills.  Patient has history of diabetes, hypertension, hypercholesterolemia, obesity, chronic kidney disease.  She had mild nonobstructive coronary disease by cath in 2010.  She has family history of coronary disease in her mother.  No diaphoresis, no worsening of pain with deep breath.  Patient has chronic asymmetric swelling of her lower extremities, no new change.  Patient is complaining of a global headache Past Medical History  Diagnosis Date  . Diabetes mellitus   . Hypertension   . Hypercholesteremia   . Obesity   . CKD (chronic kidney disease) stage 3, GFR 30-59 ml/min   . OSA (obstructive sleep apnea)   . Chest pain     Mild nonobstructive CAD (by cath Aug '10)   Past Surgical History  Procedure Laterality Date  . Tonsillectomy    . Nose surgery    . Tubal ligation    . Abdominal hysterectomy    . Adenoidectomy     Family History  Problem Relation Age of Onset  . CAD Mother   . Cancer Other   . Diabetes Other    History  Substance Use Topics  . Smoking status: Never Smoker   . Smokeless tobacco: Not on file  . Alcohol Use: Yes     Comment: rare   OB History   Grav Para Term Preterm Abortions TAB SAB Ect Mult Living                  Review of Systems  See History of Present Illness; otherwise all other systems are reviewed and negative Allergies  Review of patient's allergies indicates no known allergies.  Home Medications   Current Outpatient Rx  Name  Route  Sig  Dispense  Refill  . aspirin EC 81 MG tablet   Oral   Take 81 mg by mouth daily.           . Calcium Carbonate-Vitamin D (CALCIUM 600+D) 600-400 MG-UNIT per tablet   Oral   Take 2 tablets by mouth daily.           . carvedilol (COREG) 3.125 MG tablet   Oral   Take 3.125 mg by mouth 2 (two) times daily with a meal.         . hydrochlorothiazide (HYDRODIURIL) 25 MG tablet   Oral   Take 25 mg by mouth daily.           . insulin aspart (NOVOLOG) 100 UNIT/ML injection   Subcutaneous   Inject 10-20 Units into the skin 3 (three) times daily before meals. Takes 10 at breakfast and lunch Takes 20 at supper          . insulin glargine (LANTUS) 100 UNIT/ML injection   Subcutaneous   Inject 50 Units into the  skin at bedtime.           Marland Kitchen lisinopril (PRINIVIL,ZESTRIL) 40 MG tablet   Oral   Take 40 mg by mouth daily.           Marland Kitchen loratadine (CLARITIN) 10 MG tablet   Oral   Take 10 mg by mouth daily. allergies          . omeprazole (PRILOSEC) 20 MG capsule   Oral   Take 20 mg by mouth daily.           . sertraline (ZOLOFT) 50 MG tablet   Oral   Take 50 mg by mouth 3 (three) times daily.           . simvastatin (ZOCOR) 40 MG tablet   Oral   Take 20 mg by mouth at bedtime.            BP 155/78  Pulse 98  Temp(Src) 98.9 F (37.2 C) (Oral)  Resp 15  Ht 5\' 7"  (1.702 m)  Wt 230 lb (104.327 kg)  BMI 36.01 kg/m2  SpO2 100% Physical Exam  Constitutional: She is oriented to person, place, and time. She appears well-developed and well-nourished. No distress.  HENT:  Head: Normocephalic and atraumatic.  Nose: Nose normal.  Mouth/Throat: Oropharynx is clear and moist.  Eyes: Conjunctivae and EOM are normal. Pupils are  equal, round, and reactive to light.  Neck: Normal range of motion. Neck supple. No JVD present. No tracheal deviation present. No thyromegaly present.  Cardiovascular: Normal rate, regular rhythm, normal heart sounds and intact distal pulses.  Exam reveals no gallop and no friction rub.   No murmur heard. Pulmonary/Chest: Effort normal. No stridor. No respiratory distress. She has no wheezes. She has no rales. She exhibits tenderness (patient has point tenderness to left upper lateral chest wall.  Palpation of this area reproduces pain.  She is experiencing completely).  The patient has rhonchi in left lung  Abdominal: Soft. Bowel sounds are normal. She exhibits no distension and no mass. There is no tenderness. There is no rebound and no guarding.  Musculoskeletal: Normal range of motion. She exhibits no edema and no tenderness.  Lymphadenopathy:    She has no cervical adenopathy.  Neurological: She is alert and oriented to person, place, and time. She exhibits normal muscle tone. Coordination normal.  Skin: Skin is warm and dry. No rash noted. No erythema. No pallor.  Psychiatric: She has a normal mood and affect. Her behavior is normal. Judgment and thought content normal.    ED Course  Procedures (including critical care time) Labs Review Labs Reviewed  CBC WITH DIFFERENTIAL - Abnormal; Notable for the following:    WBC 10.9 (*)    Hemoglobin 11.2 (*)    HCT 34.9 (*)    All other components within normal limits  BASIC METABOLIC PANEL - Abnormal; Notable for the following:    Potassium 3.4 (*)    Glucose, Bld 107 (*)    Creatinine, Ser 1.48 (*)    GFR calc non Af Amer 38 (*)    GFR calc Af Amer 44 (*)    All other components within normal limits  TROPONIN I   Imaging Review No results found.  EKG Interpretation    Date/Time:  Thursday October 19 2013 05:18:16 EST Ventricular Rate:  93 PR Interval:  210 QRS Duration: 81 QT Interval:  353 QTC Calculation: 439 R  Axis:   42 Text Interpretation:  Sinus rhythm Prolonged PR interval Confirmed  by Norlene Campbell  MD, Ayjah Show [phone]) on 10/19/2013 5:21:15 AM            MDM   1. Chest pain    58 year old female with chest pain.  She has multiple risk factors for coronary disease.  Pain however, is reproducible with palpation of the chest wall.  She has had cough for the last 6 weeks which could have caused the chest wall pain.  She has coarse lung sounds on chest x-ray.  Labs pending, chest x-ray pending.  EKG without ischemic changes.  6:49 AM Pt with persistent chest pain with left arm tingling.  HA has improved.  Will start ntg, d/w hospitalist for admission.  Olivia Mackie, MD 10/19/13 [phone]

## 2013-10-20 DIAGNOSIS — N183 Chronic kidney disease, stage 3 unspecified: Secondary | ICD-10-CM

## 2013-10-20 DIAGNOSIS — R609 Edema, unspecified: Secondary | ICD-10-CM

## 2013-10-20 LAB — LIPID PANEL
CHOL/HDL RATIO: 3.1 ratio
CHOLESTEROL: 141 mg/dL (ref 0–200)
HDL: 45 mg/dL (ref >39)
LDL Cholesterol: 76 mg/dL (ref 0–99)
Triglycerides: 98 mg/dL (ref <150)
VLDL: 20 mg/dL (ref 0–40)

## 2013-10-20 LAB — GLUCOSE, CAPILLARY: Glucose-Capillary: 185 mg/dL — ABNORMAL HIGH (ref 70–99)

## 2013-10-20 MED ORDER — POTASSIUM CHLORIDE CRYS ER 20 MEQ PO TBCR: 10.0000 meq | EXTENDED_RELEASE_TABLET | Freq: Every day | ORAL | Status: DC

## 2013-10-20 NOTE — Discharge Instructions (Addendum)
You are scheduled for a Lexiscan myoview stress test 11/01/13 at 8:[facility] AM [facility]  Do not eat or drink after midnight before the test.  No caffeine for 24 hours prior to the test.    You will not have to walk for the test.  Hold your insulin that morning, but bring with you to the office.   You were cared for by a hospitalist during your hospital stay. If you have any questions about your discharge medications or the care you received while you were in the hospital after you are discharged, you can call the unit and asked to speak with the hospitalist on call if the hospitalist that took care of you is not available. Once you are discharged, your primary care physician will handle any further medical issues. Please note that NO REFILLS for any discharge medications will be authorized once you are discharged, as it is imperative that you return to your primary care physician (or establish a relationship with a primary care physician if you do not have one) for your aftercare needs so that they can reassess your need for medications and monitor your lab values.     If you do not have a primary care physician, you can call [phone] for a physician referral.  [facility]   Get CBC, CMP checked by your doctor and again as further instructed.  Get a 2 view Chest X ray done next visit if you had Pneumonia of Lung problems at the Hospital.  Get Medicines reviewed and adjusted.  Please request your Prim.MD to go over all Hospital Tests and Procedure/Radiological results at the follow up, please get all Hospital records sent to your Prim MD by signing hospital release before you go home.  Activity: As tolerated with Full fall precautions use walker/cane & assistance as needed  Diet: diabetic, heart healthy  For Heart failure patients - Check your Weight same time everyday, if you gain over 2 pounds, or you develop in leg swelling,  experience more shortness of breath or chest pain, call your Primary MD immediately. Follow Cardiac Low Salt Diet and 1.8 lit/day fluid restriction.  Disposition Home   If you experience worsening of your admission symptoms, develop shortness of breath, life threatening emergency, suicidal or homicidal thoughts you must seek medical attention immediately by calling 911 or calling your MD immediately  if symptoms less severe.  You Must read complete instructions/literature along with all the possible adverse reactions/side effects for all the Medicines you take and that have been prescribed to you. Take any new Medicines after you have completely understood and accpet all the possible adverse reactions/side effects.   Do not drive and provide baby sitting services if your were admitted for syncope or siezures until you have seen by Primary MD or a Neurologist and advised to do so again.  Do not drive when taking Pain medications.   Do not take more than prescribed Pain, Sleep and Anxiety Medications  Special Instructions: If you have smoked or chewed Tobacco  in the last 2 yrs please stop smoking, stop any regular Alcohol  and or any Recreational drug use.  Wear Seat belts while driving.  Chest Pain (Nonspecific) It is often hard to give a specific diagnosis for the cause of chest pain. There is always a chance that your pain could be related to something serious, such as a heart attack or a blood clot in the  lungs. You need to follow up with your caregiver for further evaluation. CAUSES   Heartburn.  Pneumonia or bronchitis.  Anxiety or stress.  Inflammation around your heart (pericarditis) or lung (pleuritis or pleurisy).  A blood clot in the lung.  A collapsed lung (pneumothorax). It can develop suddenly on its own (spontaneous pneumothorax) or from injury (trauma) to the chest.  Shingles infection (herpes zoster virus). The chest wall is composed of bones, muscles, and  cartilage. Any of these can be the source of the pain.  The bones can be bruised by injury.  The muscles or cartilage can be strained by coughing or overwork.  The cartilage can be affected by inflammation and become sore (costochondritis). DIAGNOSIS  Lab tests or other studies, such as X-rays, electrocardiography, stress testing, or cardiac imaging, may be needed to find the cause of your pain.  TREATMENT   Treatment depends on what may be causing your chest pain. Treatment may include:  Acid blockers for heartburn.  Anti-inflammatory medicine.  Pain medicine for inflammatory conditions.  Antibiotics if an infection is present.  You may be advised to change lifestyle habits. This includes stopping smoking and avoiding alcohol, caffeine, and chocolate.  You may be advised to keep your head raised (elevated) when sleeping. This reduces the chance of acid going backward from your stomach into your esophagus.  Most of the time, nonspecific chest pain will improve within 2 to 3 days with rest and mild pain medicine. HOME CARE INSTRUCTIONS   If antibiotics were prescribed, take your antibiotics as directed. Finish them even if you start to feel better.  For the next few days, avoid physical activities that bring on chest pain. Continue physical activities as directed.  Do not smoke.  Avoid drinking alcohol.  Only take over-the-counter or prescription medicine for pain, discomfort, or fever as directed by your caregiver.  Follow your caregiver's suggestions for further testing if your chest pain does not go away.  Keep any follow-up appointments you made. If you do not go to an appointment, you could develop lasting (chronic) problems with pain. If there is any problem keeping an appointment, you must call to reschedule. SEEK MEDICAL CARE IF:   You think you are having problems from the medicine you are taking. Read your medicine instructions carefully.  Your chest pain does  not go away, even after treatment.  You develop a rash with blisters on your chest. SEEK IMMEDIATE MEDICAL CARE IF:   You have increased chest pain or pain that spreads to your arm, neck, jaw, back, or abdomen.  You develop shortness of breath, an increasing cough, or you are coughing up blood.  You have severe back or abdominal pain, feel nauseous, or vomit.  You develop severe weakness, fainting, or chills.  You have a fever. THIS IS AN EMERGENCY. Do not wait to see if the pain will go away. Get medical help at once. Call your local emergency services (911 in [place] Do not drive yourself to the hospital. MAKE SURE YOU:   Understand these instructions.  Will watch your condition.  Will get help right away if you are not doing well or get worse. Document Released: 07/01/2005 Document Revised: 12/14/2011 Document Reviewed: 04/26/2008 [facility] Patient Information 2014 [place], [place].

## 2013-10-20 NOTE — Progress Notes (Addendum)
SUBJECTIVE:  No chest pain. No problems walking the bathroom.  OBJECTIVE:   Vitals:   Filed Vitals:   10/19/13 2136 10/20/13 0207 10/20/13 0556 10/20/13 0750  BP: 118/54 152/63 122/54   Pulse: 77 77 73 79  Temp: 98 F (36.7 C) 98.2 F (36.8 C) 97.8 F (36.6 C)   TempSrc: Oral Oral Oral   Resp: 16 16 14    Height:      Weight:      SpO2: 97% 97% 96%    I&O's:   Intake/Output Summary (Last 24 hours) at 10/20/13 0846 Last data filed at 10/19/13 1200  Gross per 24 hour  Intake    240 ml  Output      0 ml  Net    240 ml   TELEMETRY: Reviewed telemetry pt in normal sinus rhythm:     PHYSICAL EXAM General: Well developed, well nourished, in no acute distress Head:   Normal cephalic and atramatic  Lungs:   Clear bilaterally to auscultation and percussion. Heart:   HRRR S1 S2  No JVD.   Abdomen: abdomen soft and non-tender Msk:  . Normal strength and tone for age. Extremities:   No clubbing, cyanosis or edema.  DP +1 Neuro: Alert and oriented X 3. Psych:  Good affect, responds appropriately   LABS: Basic Metabolic Panel:  Recent Labs  16/10/96 0530  NA 137  K 3.4*  CL 98  CO2 28  GLUCOSE 107*  BUN 15  CREATININE 1.48*  CALCIUM 9.6   Liver Function Tests:  Recent Labs  10/19/13 0530  AST 46*  ALT 33  ALKPHOS 158*  BILITOT 0.2*  PROT 7.7  ALBUMIN 3.6   No results found for this basename: LIPASE, AMYLASE,  in the last 72 hours CBC:  Recent Labs  10/19/13 0530  WBC 10.9*  NEUTROABS 6.7  HGB 11.2*  HCT 34.9*  MCV 81.4  PLT 289   Cardiac Enzymes:  Recent Labs  10/19/13 0530 10/19/13 1145 10/19/13 1643  TROPONINI <0.30 <0.30 <0.30   BNP: No components found with this basename: POCBNP,  D-Dimer: No results found for this basename: DDIMER,  in the last 72 hours Hemoglobin A1C: No results found for this basename: HGBA1C,  in the last 72 hours Fasting Lipid Panel:  Recent Labs  10/20/13 0520  CHOL 141  HDL 45  LDLCALC 76  TRIG 98   CHOLHDL 3.1   Thyroid Function Tests: No results found for this basename: TSH, T4TOTAL, FREET3, T3FREE, THYROIDAB,  in the last 72 hours Anemia Panel: No results found for this basename: VITAMINB12, FOLATE, FERRITIN, TIBC, IRON, RETICCTPCT,  in the last 72 hours Coag Panel:   Lab Results  Component Value Date   INR 1.00 10/08/2011   INR 1.1 05/23/2009    RADIOLOGY: Dg Chest 2 View  10/19/2013   CLINICAL DATA:  Sudden onset chest pain  EXAM: CHEST  2 VIEW  COMPARISON:  10/08/2011  FINDINGS: Generous heart size, stable from prior. Chronic bronchitic changes. No edema or consolidation. No effusion or pneumothorax.  IMPRESSION: No evidence of acute cardiopulmonary disease.   Electronically Signed   By: Tiburcio Pea M.D.   On: 10/19/2013 06:40      ASSESSMENT: Atypical chest pain with risk factors for coronary artery disease.  PLAN:  She is ruled out for MI with enzymes. EKG is normal. She will be set up for an outpatient stress test in our office. Continue aggressive risk factor modification. She will followup with Dr.  McAlHany.   Mild renal insufficiency noted.  Corky CraftsVARANASI,Shynia Daleo S., MD  10/20/2013  8:46 AM

## 2013-10-20 NOTE — Progress Notes (Signed)
D/C instructions reviewed w/ pt and family member. Pt verbalized understanding, all questions answered, no further questions. Pt d/c in w/c in stable condition by NT. Pt in possession of d/c instructions, script for KCL and all personal belongings.

## 2013-10-20 NOTE — Progress Notes (Signed)
VASCULAR LAB PRELIMINARY  PRELIMINARY  PRELIMINARY  PRELIMINARY  Right lower extremity venous duplex completed.    Preliminary report:  Right:  No evidence of DVT, superficial thrombosis, or Baker's cyst.  Smriti Barkow, RVS 10/20/2013, 9:58 AM

## 2013-10-20 NOTE — Progress Notes (Signed)
Inpatient Diabetes Program Recommendations  AACE/ADA: New Consensus Statement on Inpatient Glycemic Control (2013)  Target Ranges:  Prepandial:   less than 140 mg/dL      Peak postprandial:   less than 180 mg/dL (1-2 hours)      Critically ill patients:  140 - 180 mg/dL   Results for Tara Patel, Tara Patel (MRN [id number]) as of 10/20/2013 09:52  Ref. Range 10/19/2013 12:15 10/19/2013 17:34 10/19/2013 22:16 10/20/2013 07:44  Glucose-Capillary Latest Range: 70-99 mg/dL 914161 (H) 782176 (H) 956246 (H) 185 (H)    Inpatient Diabetes Program Recommendations HgbA1C: Please order an A1C to evaluate glycemic control over the past 2-3 months.  Last A1C in the chart is 8.6% on 05/21/09.  Thanks, Orlando PennerMarie Nailea Whitehorn, RN, MSN, CCRN Diabetes Coordinator Inpatient Diabetes Program [phone] (Team Pager) [phone] ([facility] office) [phone] [facility] office)

## 2013-10-20 NOTE — Discharge Summary (Signed)
Physician Discharge Summary  Leonel RamsayJennifer L Siedschlag [id number] DOB: Dec 01, 1955 DOA: 10/19/2013  [facility]  Admit date: 10/19/2013 Discharge date: 10/20/2013  Time spent: 35 minutes  Recommendations for Outpatient Follow-up:  1. Follow up with PCP in 1 week 2. Follow up with cardiology as scheduled for an outpatient stress test   Recommendations for primary care physician for things to follow:  Post hospital follow up.   Discharge Diagnoses:  Principal Problem:   Chest pain with moderate risk for cardiac etiology Active Problems:   Diabetes mellitus, type 2   Hypertension   Hyperlipidemia   Obesity   Obstructive sleep apnea   CKD (chronic kidney disease) stage 3, GFR 30-59 ml/min   Hypokalemia   Cough   Unspecified chronic bronchitis   Depression   GERD (gastroesophageal reflux disease)   Normocytic anemia  Discharge Condition: stable  Diet recommendation: heart healthy  Filed Weights   10/19/13 0513 10/19/13 0817  Weight: 104.327 kg (230 lb) 106.8 kg (235 lb 7.2 oz)    History of present illness:  Leonel RamsayJennifer L Shillingburg is an 58 y.o. female with a PMH of DM, HTN, HLD, obesity and stage III CKD, who presents with the gradual onset of left sided chest pain that began at 2:00 a.m. When the patient was at rest. Pain was rated 8/10 and described as a pressure like sensation. No radiation of pain at the time, but is now complaining that her left arm feels numb. Pain was persistent for about 2 hours, at which time her nephew brought her to the ED for evaluation. Pain has improved to a level 4/10 after receiving ASA and SL NTG x 2. Had some nausea and dizziness en route to the hospital, but no diaphoresis. The patient had a cardiac catheterization done 05/23/2009 by Dr. Verne Carrowhristopher McAlhany which showed mild nonobstructive coronary artery disease and normal left ventricular systolic function. She does have significant coronary artery disease risk factors including a  positive family history, a history of diabetes, hypertension and hyperlipidemia.  Hospital Course:  Principal Problem:  Chest pain with moderate risk for cardiac etiology - Patient has several risk factors for coronary artery disease including diabetes, hypertension and hyperlipidemia as well as OSA. She is obese. She had a heart catheterization 05/2009 which showed mild nonobstructive coronary disease. She has been medically managed. Her chest pain has some atypical features in as it is reproducible with palpation. Cardiology has been consulted and will pursue a stress test as an outpatient. Cardiac enzymes remained negative during her hospitalization.  Diabetes mellitus, type 2 - continue previous home medications.  Hypertension Continue Coreg, HCTZ and lisinopril.  Hyperlipidemia Continue Zocor.  Obesity Carbohydrate modified diet.  Obstructive sleep apnea CPAP each bedtime per her home regimen.  CKD (chronic kidney disease) stage 3, GFR 30-59 ml/min Baseline creatinine 1.2-1.5. Current creatinine at usual baseline values. Avoid nephrotoxins.  Hypokalemia Likely related to diuretic therapy.  Cough / unspecified chronic bronchitis Chronic LLE swelling - patient has been having swelling for almost all her life. DVT US negative for DVT (preliminary read, full read pending.   Procedures:  none   Consultations:  Cardiology   Discharge Exam: Filed Vitals:   10/20/13 0207 10/20/13 0556 10/20/13 0750 10/20/13 0928  BP: 152/63 122/54  144/67  Pulse: 77 73 79 71  Temp: 98.2 F (36.8 C) 97.8 F (36.6 C)    TempSrc: Oral Oral    Resp: 16 14    Height:  Weight:      SpO2: 97% 96%     General: NAD Cardiovascular: RRR; tenderness across left anterior chest wall with palpation.  Respiratory: CTA biL  Discharge Instructions   Future Appointments Provider Department Dept Phone   11/01/2013 8:15 AM [facility] [place]) [facility] SITE 3 NUCLEAR MED  [phone]   11/03/2013 11:00 AM Rosalio Macadamia, NP [facility] Office [phone]       Medication List         aspirin EC 81 MG tablet  Take 81 mg by mouth daily.     CALCIUM 600+D 600-400 MG-UNIT per tablet  Generic drug:  Calcium Carbonate-Vitamin D  Take 2 tablets by mouth daily.     carvedilol 3.125 MG tablet  Commonly known as:  COREG  Take 3.125 mg by mouth 2 (two) times daily with a meal.     hydrochlorothiazide 25 MG tablet  Commonly known as:  HYDRODIURIL  Take 25 mg by mouth daily.     insulin aspart 100 UNIT/ML injection  Commonly known as:  novoLOG  - Inject 16-26 Units into the skin 2 (two) times daily. Takes 26 at breakfast and lunch  - Takes 16 at supper     insulin glargine 100 UNIT/ML injection  Commonly known as:  LANTUS  Inject 30-36 Units into the skin 2 (two) times daily. Takes 30 units in the morning and 36 units at bedtime     lisinopril 40 MG tablet  Commonly known as:  PRINIVIL,ZESTRIL  Take 40 mg by mouth daily.     loratadine 10 MG tablet  Commonly known as:  CLARITIN  Take 10 mg by mouth daily. allergies     omeprazole 20 MG capsule  Commonly known as:  PRILOSEC  Take 20 mg by mouth daily.     potassium chloride SA 20 MEQ tablet  Commonly known as:  K-DUR,KLOR-CON  Take 0.5 tablets (10 mEq total) by mouth daily.     sertraline 50 MG tablet  Commonly known as:  ZOLOFT  Take 50 mg by mouth 3 (three) times daily.     simvastatin 40 MG tablet  Commonly known as:  ZOCOR  Take 20 mg by mouth at bedtime.           Follow-up Information   Follow up with Norma Fredrickson, NP On 11/03/2013. (at 11:00 AM for follow up of stress test)    Specialty:  Nurse Practitioner   Contact information:   [address] [place] [address] [phone]       Follow up with [facility]. Schedule an appointment as soon as possible for a visit in 1 week.   Specialty:  General Practice   Contact  information:   [address]  [place] [address] [phone]       The results of significant diagnostics from this hospitalization (including imaging, microbiology, ancillary and laboratory) are listed below for reference.    Significant Diagnostic Studies: Dg Chest 2 View  10/19/2013   CLINICAL DATA:  Sudden onset chest pain  EXAM: CHEST  2 VIEW  COMPARISON:  10/08/2011  FINDINGS: Generous heart size, stable from prior. Chronic bronchitic changes. No edema or consolidation. No effusion or pneumothorax.  IMPRESSION: No evidence of acute cardiopulmonary disease.   Electronically Signed   By: Tiburcio Pea M.D.   On: 10/19/2013 06:40    Microbiology: No results found for this or any previous  visit (from the past 240 hour(s)).   Labs: Basic Metabolic Panel:  Recent Labs Lab 10/19/13 0530  NA 137  K 3.4*  CL 98  CO2 28  GLUCOSE 107*  BUN 15  CREATININE 1.48*  CALCIUM 9.6   Liver Function Tests:  Recent Labs Lab 10/19/13 0530  AST 46*  ALT 33  ALKPHOS 158*  BILITOT 0.2*  PROT 7.7  ALBUMIN 3.6   CBC:  Recent Labs Lab 10/19/13 0530  WBC 10.9*  NEUTROABS 6.7  HGB 11.2*  HCT 34.9*  MCV 81.4  PLT 289   Cardiac Enzymes:  Recent Labs Lab 10/19/13 0530 10/19/13 1145 10/19/13 1643  TROPONINI <0.30 <0.30 <0.30   BNP: BNP (last 3 results)  Recent Labs  10/19/13 0530  PROBNP 43.8   CBG:  Recent Labs Lab 10/19/13 1215 10/19/13 1734 10/19/13 2216 10/20/13 0744  GLUCAP 161* 176* 246* 185*       Signed:  Aseneth Hack  [facility] 10/20/2013, 3:58 PM

## 2013-11-01 ENCOUNTER — Ambulatory Visit (HOSPITAL_COMMUNITY): Payer: Non-veteran care | Attending: Cardiology | Admitting: Radiology

## 2013-11-01 ENCOUNTER — Encounter: Payer: Self-pay | Admitting: Cardiology

## 2013-11-01 VITALS — BP 143/75 | HR 76 | Ht 67.0 in | Wt 234.0 lb

## 2013-11-01 DIAGNOSIS — I2510 Atherosclerotic heart disease of native coronary artery without angina pectoris: Secondary | ICD-10-CM | POA: Insufficient documentation

## 2013-11-01 DIAGNOSIS — R0602 Shortness of breath: Secondary | ICD-10-CM

## 2013-11-01 DIAGNOSIS — R079 Chest pain, unspecified: Secondary | ICD-10-CM | POA: Insufficient documentation

## 2013-11-01 DIAGNOSIS — E119 Type 2 diabetes mellitus without complications: Secondary | ICD-10-CM | POA: Insufficient documentation

## 2013-11-01 MED ORDER — TECHNETIUM TC 99M SESTAMIBI GENERIC - CARDIOLITE
30.0000 | Freq: Once | INTRAVENOUS | Status: AC | PRN
  Administered 2013-11-01: 30 via INTRAVENOUS

## 2013-11-01 MED ORDER — REGADENOSON 0.4 MG/5ML IV SOLN
0.4000 mg | Freq: Once | INTRAVENOUS | Status: AC
  Administered 2013-11-01: 0.4 mg via INTRAVENOUS

## 2013-11-01 MED ORDER — TECHNETIUM TC 99M SESTAMIBI GENERIC - CARDIOLITE
10.0000 | Freq: Once | INTRAVENOUS | Status: AC | PRN
  Administered 2013-11-01: 10 via INTRAVENOUS

## 2013-11-01 NOTE — Progress Notes (Signed)
[facility] SITE 3 NUCLEAR MED [address] [place], [place] [address] [phone]    Cardiology Nuclear Med Study  Leonel RamsayJennifer L Patel is a 58 y.o. female     MRN : [id number]     DOB: Apr 17, 1956  Procedure Date: 11/01/2013  Nuclear Med Background Indication for Stress Test:  Evaluation for Ischemia and [facility]  1/15 CP,R/O MI History:  No known CAD, Cath 2010 (normal), MPI 2010 (mild isch.) EF 65%, Asthma Cardiac Risk Factors: Family History - CAD, Hypertension, IDDM Type 2 and Lipids  Symptoms:  Chest Pain (last date of chest discomfort was January 15), Dizziness and SOB   Nuclear Pre-Procedure Caffeine/Decaff Intake:  None NPO After: 6 pm   Lungs:  clear O2 Sat: 96% on room air. IV 0.9% NS with Angio Cath:  22g  IV Site: R Antecubital  IV Started by:  Bonnita LevanJackie Smith, RN  Chest Size (in):  42 Cup Size: C  Height: 5\' 7"  (1.702 m)  Weight:  234 lb (106.142 kg)  BMI:  Body mass index is 36.64 kg/(m^2). Tech Comments:  CBG @ 6:30 AM= 162    Nuclear Med Study 1 or 2 day study: 1 day  Stress Test Type:  Lexiscan  Reading MD: N/A  Order Authorizing Provider:  Willa RoughJeffrey Katz, MD  Resting Radionuclide: Technetium 6663m Sestamibi  Resting Radionuclide Dose: 11.0 mCi   Stress Radionuclide:  Technetium 4763m Sestamibi  Stress Radionuclide Dose: 33.0 mCi           Stress Protocol Rest HR: 76 Stress HR: 116  Rest BP: 143/75 Stress BP: 116/58  Exercise Time (min): n/a METS: n/a           Dose of Adenosine (mg):  n/a Dose of Lexiscan: 0.4 mg  Dose of Atropine (mg): n/a Dose of Dobutamine: n/a mcg/kg/min (at max HR)  Stress Test Technologist: Nelson ChimesSharon Brooks, BS-ES  Nuclear Technologist:  Domenic PoliteStephen Carbone, CNMT     Rest Procedure:  Myocardial perfusion imaging was performed at rest 45 minutes following the intravenous administration of Technetium 7263m Sestamibi. Rest ECG: NSR with poor R wave progression.   Stress Procedure:  The patient received IV Lexiscan 0.4 mg over  15-seconds with concurrent low level exercise and then Technetium 4263m Sestamibi was injected at 30-seconds while the patient continued walking one more minute.  Quantitative spect images were obtained after a 45-minute delay.  During the infusion of Lexiscan, the patient complained of having a warm sensation.  This resolved in recovery.  Stress ECG: No significant change from baseline ECG  QPS Raw Data Images:  Normal; no motion artifact; normal heart/lung ratio. Stress Images:  Normal homogeneous uptake in all areas of the myocardium. Rest Images:  Normal homogeneous uptake in all areas of the myocardium. Subtraction (SDS):  No evidence of ischemia. Transient Ischemic Dilatation (Normal <1.22):  0.92 Lung/Heart Ratio (Normal <0.45):  0.16  Quantitative Gated Spect Images QGS EDV:  90 ml QGS ESV:  32 ml  Impression Exercise Capacity:  Lexiscan with low level exercise. BP Response:  Normal blood pressure response. Clinical Symptoms:  Patient felt a warm sensation. No chest pain.  ECG Impression:  No significant ST segment change suggestive of ischemia. Comparison with Prior Nuclear Study: No images to compare  Overall Impression:  Low risk stress nuclear study no ischemia. .  LV Ejection Fraction: 65%.  LV Wall Motion:  NL LV Function; NL Wall Motion    Donato SchultzSKAINS, MARK, MD

## 2013-11-03 ENCOUNTER — Ambulatory Visit (INDEPENDENT_AMBULATORY_CARE_PROVIDER_SITE_OTHER): Payer: Non-veteran care | Admitting: Nurse Practitioner

## 2013-11-03 ENCOUNTER — Encounter: Payer: Self-pay | Admitting: Cardiology

## 2013-11-03 ENCOUNTER — Encounter: Payer: Self-pay | Admitting: Nurse Practitioner

## 2013-11-03 VITALS — BP 140/80 | HR 80 | Ht 68.4 in | Wt 233.1 lb

## 2013-11-03 DIAGNOSIS — R079 Chest pain, unspecified: Secondary | ICD-10-CM

## 2013-11-03 NOTE — Patient Instructions (Signed)
I think you are doing well  Your stress test looks to be low risk - this is good  Take care of yourself - keep working on your sugars, etc.  We will see you back as needed.  Call the [facility] office at [phone] if you have any questions, problems or concerns.

## 2013-11-03 NOTE — Progress Notes (Signed)
Leonel RamsayJennifer L Gharibian Date of Birth: [birth year] Medical Record #[id number]  History of Present Illness: Ms. Shirlee LimerickMarion is seen back today for a post hospital visit. Seen for Dr. Clifton JamesMcAlhany. She has HTN, HLD, oSA, CKD, depression, GERD, anemia and DM. She is obese.   Most recently admitted with chest pain - negative admission - referred for outpatient stress testing - this turned out to be a low risk study.   Comes back today. Here alone. Doing ok. No further chest pain. Tolerating her medicines. [facility] [place] Stress test looked low risk. Says her BP is lower at home. Feels ok on her current medicines.   Current Outpatient Prescriptions  Medication Sig Dispense Refill  . aspirin EC 81 MG tablet Take 81 mg by mouth daily.        . Calcium Carbonate-Vitamin D (CALCIUM 600+D) 600-400 MG-UNIT per tablet Take 2 tablets by mouth daily.        . carvedilol (COREG) 12.5 MG tablet Take 12.5 mg by mouth 2 (two) times daily with a meal.      . hydrochlorothiazide (HYDRODIURIL) 25 MG tablet Take 25 mg by mouth daily.        . insulin aspart (NOVOLOG) 100 UNIT/ML injection Inject 16-26 Units into the skin 2 (two) times daily. Takes 26 at breakfast and  Takes 16 at supper      . insulin glargine (LANTUS) 100 UNIT/ML injection Inject 30-36 Units into the skin 2 (two) times daily. Takes 30 units in the morning and 36 units at bedtime      . lisinopril (PRINIVIL,ZESTRIL) 40 MG tablet Take 40 mg by mouth daily.        Marland Kitchen. loratadine (CLARITIN) 10 MG tablet Take 10 mg by mouth daily. allergies       . omeprazole (PRILOSEC) 20 MG capsule Take 20 mg by mouth daily.        . potassium chloride SA (K-DUR,KLOR-CON) 20 MEQ tablet Take 0.5 tablets (10 mEq total) by mouth daily.  15 tablet  0  . sertraline (ZOLOFT) 100 MG tablet Take 150 mg by mouth daily.      . simvastatin (ZOCOR) 40 MG tablet Take 20 mg by mouth at bedtime.         No current facility-administered medications for this  visit.    No Known Allergies  Past Medical History  Diagnosis Date  . Diabetes mellitus   . Hypertension   . Hypercholesteremia   . Obesity   . CKD (chronic kidney disease) stage 3, GFR 30-59 ml/min   . OSA (obstructive sleep apnea)     CPAP Q HS  . Chest pain     Mild nonobstructive CAD (by cath Aug '10)  . Depression   . GERD (gastroesophageal reflux disease)   . Coronary artery disease     Mild, nonobstructive by catheterization 05/23/2009  . Normocytic anemia 10/19/2013    Past Surgical History  Procedure Laterality Date  . Tonsillectomy    . Nasal septum surgery    . Tubal ligation    . Abdominal hysterectomy    . Adenoidectomy      History  Smoking status  . Never Smoker   Smokeless tobacco  . Never Used    History  Alcohol Use  . Yes    Comment: Rare     Family History  Problem Relation Age of Onset  . CAD Mother     s/p triple bypass  .  Cancer Other   . Diabetes Other   . Diabetes Mother   . Hypertension Mother   . Cancer Sister     Colon  . Cancer Brother     Colon  . Diabetes Brother     Review of Systems: The review of systems is per the HPI.  All other systems were reviewed and are negative.  Physical Exam: BP 140/80  Pulse 80  Ht 5' 8.4" (1.737 m)  Wt 233 lb 1.9 oz (105.743 kg)  BMI 35.05 kg/m2 Patient is alert and in no acute distress. Affect a little flat. Skin is warm and dry. Color is normal.  HEENT is unremarkable. Normocephalic/atraumatic. PERRL. Sclera are nonicteric. Neck is supple. No masses. No JVD. Lungs are clear. Cardiac exam shows a regular rate and rhythm. Abdomen is soft. Extremities are without edema. Gait and ROM are intact. No gross neurologic deficits noted.  LABORATORY DATA:  Lab Results  Component Value Date   WBC 10.9* 10/19/2013   HGB 11.2* 10/19/2013   HCT 34.9* 10/19/2013   PLT 289 10/19/2013   GLUCOSE 107* 10/19/2013   CHOL 141 10/20/2013   TRIG 98 10/20/2013   HDL 45 10/20/2013   LDLCALC 76 10/20/2013    ALT 33 10/19/2013   AST 46* 10/19/2013   NA 137 10/19/2013   K 3.4* 10/19/2013   CL 98 10/19/2013   CREATININE 1.48* 10/19/2013   BUN 15 10/19/2013   CO2 28 10/19/2013   TSH 1.457 Test methodology is 3rd generation TSH 05/21/2009   INR 1.00 10/08/2011   HGBA1C  Value: 8.6 (NOTE) The ADA recommends the following therapeutic goal for glycemic control related to Hgb A1c measurement: Goal of therapy: <6.5 Hgb A1c  Reference: American Diabetes Association: Clinical Practice Recommendations 2010, Diabetes Care, 2010, 33: (Suppl  1).* 05/21/2009   Lab Results  Component Value Date   CKTOTAL 152 10/08/2011   CKMB 2.0 10/08/2011   TROPONINI <0.30 10/19/2013     Impression  Exercise Capacity: Lexiscan with low level exercise.  BP Response: Normal blood pressure response.  Clinical Symptoms: Patient felt a warm sensation. No chest pain.  ECG Impression: No significant ST segment change suggestive of ischemia.  Comparison with Prior Nuclear Study: No images to compare  Overall Impression: Low risk stress nuclear study no ischemia. .  LV Ejection Fraction: 65%. LV Wall Motion: NL LV Function; NL Wall Motion  SKAINS, MARK, MD   Assessment / Plan: 1. Atypical chest pain - with multiple CV risk factors - low risk Myoview - would favor continued CV risk factor modification - see back prn.   2. HTN - reportedly with lower readings at home.   3. DM  4. Obesity  5. HLD  See back prn.   Patient is agreeable to this plan and will call if any problems develop in the interim.   Rosalio Macadamia, RN, ANP-C [facility] [address] [place], [place]  [address] [phone]

## 2014-01-30 ENCOUNTER — Encounter (HOSPITAL_COMMUNITY): Payer: Self-pay | Admitting: Emergency Medicine

## 2014-01-30 ENCOUNTER — Emergency Department (HOSPITAL_COMMUNITY): Payer: Non-veteran care

## 2014-01-30 ENCOUNTER — Emergency Department (HOSPITAL_COMMUNITY)
Admission: EM | Admit: 2014-01-30 | Discharge: 2014-01-30 | Disposition: A | Discharge status: Home / Self Care | Payer: Non-veteran care | Attending: Emergency Medicine | Admitting: Emergency Medicine

## 2014-01-30 DIAGNOSIS — Z8669 Personal history of other diseases of the nervous system and sense organs: Secondary | ICD-10-CM | POA: Insufficient documentation

## 2014-01-30 DIAGNOSIS — R51 Headache: Secondary | ICD-10-CM | POA: Insufficient documentation

## 2014-01-30 DIAGNOSIS — Z9851 Tubal ligation status: Secondary | ICD-10-CM | POA: Insufficient documentation

## 2014-01-30 DIAGNOSIS — N183 Chronic kidney disease, stage 3 unspecified: Secondary | ICD-10-CM | POA: Insufficient documentation

## 2014-01-30 DIAGNOSIS — I2510 Atherosclerotic heart disease of native coronary artery without angina pectoris: Secondary | ICD-10-CM | POA: Insufficient documentation

## 2014-01-30 DIAGNOSIS — E7800 Pure hypercholesterolemia, unspecified: Secondary | ICD-10-CM | POA: Insufficient documentation

## 2014-01-30 DIAGNOSIS — R112 Nausea with vomiting, unspecified: Secondary | ICD-10-CM

## 2014-01-30 DIAGNOSIS — J45909 Unspecified asthma, uncomplicated: Secondary | ICD-10-CM | POA: Insufficient documentation

## 2014-01-30 DIAGNOSIS — R638 Other symptoms and signs concerning food and fluid intake: Secondary | ICD-10-CM | POA: Insufficient documentation

## 2014-01-30 DIAGNOSIS — Z794 Long term (current) use of insulin: Secondary | ICD-10-CM | POA: Insufficient documentation

## 2014-01-30 DIAGNOSIS — E119 Type 2 diabetes mellitus without complications: Secondary | ICD-10-CM

## 2014-01-30 DIAGNOSIS — Z87440 Personal history of urinary (tract) infections: Secondary | ICD-10-CM | POA: Insufficient documentation

## 2014-01-30 DIAGNOSIS — R109 Unspecified abdominal pain: Secondary | ICD-10-CM

## 2014-01-30 DIAGNOSIS — Z90710 Acquired absence of both cervix and uterus: Secondary | ICD-10-CM | POA: Insufficient documentation

## 2014-01-30 DIAGNOSIS — K219 Gastro-esophageal reflux disease without esophagitis: Secondary | ICD-10-CM | POA: Insufficient documentation

## 2014-01-30 DIAGNOSIS — F3289 Other specified depressive episodes: Secondary | ICD-10-CM | POA: Insufficient documentation

## 2014-01-30 DIAGNOSIS — Z79899 Other long term (current) drug therapy: Secondary | ICD-10-CM | POA: Insufficient documentation

## 2014-01-30 DIAGNOSIS — E669 Obesity, unspecified: Secondary | ICD-10-CM | POA: Insufficient documentation

## 2014-01-30 DIAGNOSIS — I129 Hypertensive chronic kidney disease with stage 1 through stage 4 chronic kidney disease, or unspecified chronic kidney disease: Secondary | ICD-10-CM | POA: Insufficient documentation

## 2014-01-30 DIAGNOSIS — R197 Diarrhea, unspecified: Secondary | ICD-10-CM | POA: Insufficient documentation

## 2014-01-30 DIAGNOSIS — R509 Fever, unspecified: Secondary | ICD-10-CM | POA: Insufficient documentation

## 2014-01-30 DIAGNOSIS — F329 Major depressive disorder, single episode, unspecified: Secondary | ICD-10-CM | POA: Insufficient documentation

## 2014-01-30 DIAGNOSIS — Z862 Personal history of diseases of the blood and blood-forming organs and certain disorders involving the immune mechanism: Secondary | ICD-10-CM | POA: Insufficient documentation

## 2014-01-30 DIAGNOSIS — Z7982 Long term (current) use of aspirin: Secondary | ICD-10-CM | POA: Insufficient documentation

## 2014-01-30 HISTORY — DX: Unspecified asthma, uncomplicated: J45.909

## 2014-01-30 LAB — URINE MICROSCOPIC-ADD ON

## 2014-01-30 LAB — COMPREHENSIVE METABOLIC PANEL WITH GFR
ALT: 20 U/L (ref 0–35)
AST: 23 U/L (ref 0–37)
CO2: 27 meq/L (ref 19–32)
Chloride: 97 meq/L (ref 96–112)
Sodium: 140 meq/L (ref 137–147)
Total Bilirubin: 0.7 mg/dL (ref 0.3–1.2)

## 2014-01-30 LAB — COMPREHENSIVE METABOLIC PANEL
Albumin: 3.7 g/dL (ref 3.5–5.2)
Alkaline Phosphatase: 117 U/L (ref 39–117)
BUN: 17 mg/dL (ref 6–23)
Calcium: 9.4 mg/dL (ref 8.4–10.5)
Creatinine, Ser: 1.31 mg/dL — ABNORMAL HIGH (ref 0.50–1.10)
GFR calc Af Amer: 51 mL/min — ABNORMAL LOW (ref >90)
GFR calc non Af Amer: 44 mL/min — ABNORMAL LOW (ref >90)
Glucose, Bld: 227 mg/dL — ABNORMAL HIGH (ref 70–99)
Potassium: 3.5 mEq/L — ABNORMAL LOW (ref 3.7–5.3)
Total Protein: 7.8 g/dL (ref 6.0–8.3)

## 2014-01-30 LAB — CBC WITH DIFFERENTIAL/PLATELET
Basophils Absolute: 0 K/uL (ref 0.0–0.1)
Basophils Relative: 0 % (ref 0–1)
Eosinophils Absolute: 0.1 10*3/uL (ref 0.0–0.7)
Eosinophils Relative: 1 % (ref 0–5)
HCT: 40.5 % (ref 36.0–46.0)
Hemoglobin: 12.6 g/dL (ref 12.0–15.0)
Lymphocytes Relative: 6 % — ABNORMAL LOW (ref 12–46)
Lymphs Abs: 0.5 10*3/uL — ABNORMAL LOW (ref 0.7–4.0)
MCH: 25.5 pg — ABNORMAL LOW (ref 26.0–34.0)
MCHC: 31.1 g/dL (ref 30.0–36.0)
MCV: 82 fL (ref 78.0–100.0)
Monocytes Absolute: 0.4 10*3/uL (ref 0.1–1.0)
Monocytes Relative: 5 % (ref 3–12)
Neutro Abs: 7.8 K/uL — ABNORMAL HIGH (ref 1.7–7.7)
Neutrophils Relative %: 88 % — ABNORMAL HIGH (ref 43–77)
Platelets: 230 K/uL (ref 150–400)
RBC: 4.94 MIL/uL (ref 3.87–5.11)
RDW: 13.5 % (ref 11.5–15.5)
WBC: 8.8 K/uL (ref 4.0–10.5)

## 2014-01-30 LAB — URINALYSIS, ROUTINE W REFLEX MICROSCOPIC
Glucose, UA: NEGATIVE mg/dL
Ketones, ur: 15 mg/dL — AB
Leukocytes, UA: NEGATIVE
Nitrite: NEGATIVE
Protein, ur: 100 mg/dL — AB
Specific Gravity, Urine: 1.027 (ref 1.005–1.030)
Urobilinogen, UA: 0.2 mg/dL (ref 0.0–1.0)
pH: 5 (ref 5.0–8.0)

## 2014-01-30 LAB — LIPASE, BLOOD: Lipase: 75 U/L — ABNORMAL HIGH (ref 11–59)

## 2014-01-30 MED ORDER — IOHEXOL 300 MG/ML  SOLN
25.0000 mL | Freq: Once | INTRAMUSCULAR | Status: AC | PRN
  Administered 2014-01-30: 25 mL via ORAL

## 2014-01-30 MED ORDER — ONDANSETRON 8 MG PO TBDP: 8.0000 mg | ORAL_TABLET | Freq: Two times a day (BID) | ORAL | Status: DC | PRN

## 2014-01-30 MED ORDER — HYDROCODONE-ACETAMINOPHEN 5-325 MG PO TABS: ORAL_TABLET | ORAL | Status: DC

## 2014-01-30 MED ORDER — HYDROMORPHONE HCL PF 1 MG/ML IJ SOLN
0.5000 mg | Freq: Once | INTRAMUSCULAR | Status: AC
Start: 2014-01-30 — End: 2014-01-30
  Administered 2014-01-30: 0.5 mg via INTRAVENOUS
  Filled 2014-01-30: qty 1

## 2014-01-30 MED ORDER — ONDANSETRON HCL 4 MG/2ML IJ SOLN
4.0000 mg | Freq: Once | INTRAMUSCULAR | Status: AC
  Administered 2014-01-30: 4 mg via INTRAVENOUS
  Filled 2014-01-30: qty 2

## 2014-01-30 MED ORDER — IOHEXOL 300 MG/ML  SOLN
100.0000 mL | Freq: Once | INTRAMUSCULAR | Status: AC | PRN
  Administered 2014-01-30: 100 mL via INTRAVENOUS

## 2014-01-30 NOTE — Discharge Instructions (Signed)
Abdominal Pain, Adult Many things can cause abdominal pain. Usually, abdominal pain is not caused by a disease and will improve without treatment. It can often be observed and treated at home. Your health care provider will do a physical exam and possibly order blood tests and X-rays to help determine the seriousness of your pain. However, in many cases, more time must pass before a clear cause of the pain can be found. Before that point, your health care provider may not know if you need more testing or further treatment. HOME CARE INSTRUCTIONS  Monitor your abdominal pain for any changes. The following actions may help to alleviate any discomfort you are experiencing:  Only take over-the-counter or prescription medicines as directed by your health care provider.  Do not take laxatives unless directed to do so by your health care provider.  Try a clear liquid diet (broth, tea, or water) as directed by your health care provider. Slowly move to a bland diet as tolerated. SEEK MEDICAL CARE IF:  You have unexplained abdominal pain.  You have abdominal pain associated with nausea or diarrhea.  You have pain when you urinate or have a bowel movement.  You experience abdominal pain that wakes you in the night.  You have abdominal pain that is worsened or improved by eating food.  You have abdominal pain that is worsened with eating fatty foods. SEEK IMMEDIATE MEDICAL CARE IF:   Your pain does not go away within 2 hours.  You have a fever.  You keep throwing up (vomiting).  Your pain is felt only in portions of the abdomen, such as the right side or the left lower portion of the abdomen.  You pass bloody or black tarry stools. MAKE SURE YOU:  Understand these instructions.   Will watch your condition.   Will get help right away if you are not doing well or get worse.  Document Released: 07/01/2005 Document Revised: 07/12/2013 Document Reviewed: 05/31/2013 [facility] Patient  Information 2014 [employer], [place].    Diarrhea Diarrhea is frequent loose and watery bowel movements. It can cause you to feel weak and dehydrated. Dehydration can cause you to become tired and thirsty, have a dry mouth, and have decreased urination that often is dark yellow. Diarrhea is a sign of another problem, most often an infection that will not last long. In most cases, diarrhea typically lasts 2 3 days. However, it can last longer if it is a sign of something more serious. It is important to treat your diarrhea as directed by your caregive to lessen or prevent future episodes of diarrhea. CAUSES  Some common causes include:  Gastrointestinal infections caused by viruses, bacteria, or parasites.  Food poisoning or food allergies.  Certain medicines, such as antibiotics, chemotherapy, and laxatives.  Artificial sweeteners and fructose.  Digestive disorders. HOME CARE INSTRUCTIONS  Ensure adequate fluid intake (hydration): have 1 cup (8 oz) of fluid for each diarrhea episode. Avoid fluids that contain simple sugars or sports drinks, fruit juices, whole milk products, and sodas. Your urine should be clear or pale yellow if you are drinking enough fluids. Hydrate with an oral rehydration solution that you can purchase at pharmacies, retail stores, and online. You can prepare an oral rehydration solution at home by mixing the following ingredients together:    tsp table salt.   tsp baking soda.   tsp salt substitute containing potassium chloride.  1  tablespoons sugar.  1 L (34 oz) of water.  Certain foods and beverages may  increase the speed at which food moves through the gastrointestinal (GI) tract. These foods and beverages should be avoided and include:  Caffeinated and alcoholic beverages.  High-fiber foods, such as raw fruits and vegetables, nuts, seeds, and whole grain breads and cereals.  Foods and beverages sweetened with sugar alcohols, such as xylitol, sorbitol, and  mannitol.  Some foods may be well tolerated and may help thicken stool including:  Starchy foods, such as rice, toast, pasta, low-sugar cereal, oatmeal, grits, baked potatoes, crackers, and bagels.  Bananas.  Applesauce.  Add probiotic-rich foods to help increase healthy bacteria in the GI tract, such as yogurt and fermented milk products.  Wash your hands well after each diarrhea episode.  Only take over-the-counter or prescription medicines as directed by your caregiver.  Take a warm bath to relieve any burning or pain from frequent diarrhea episodes. SEEK IMMEDIATE MEDICAL CARE IF:   You are unable to keep fluids down.  You have persistent vomiting.  You have blood in your stool, or your stools are black and tarry.  You do not urinate in 6 8 hours, or there is only a small amount of very dark urine.  You have abdominal pain that increases or localizes.  You have weakness, dizziness, confusion, or lightheadedness.  You have a severe headache.  Your diarrhea gets worse or does not get better.  You have a fever or persistent symptoms for more than 2 3 days.  You have a fever and your symptoms suddenly get worse. MAKE SURE YOU:   Understand these instructions.  Will watch your condition.  Will get help right away if you are not doing well or get worse. Document Released: 09/11/2002 Document Revised: 09/07/2012 Document Reviewed: 05/29/2012 [facility] Patient Information 2014 [place], [place].     Nausea and Vomiting Nausea is a sick feeling that often comes before throwing up (vomiting). Vomiting is a reflex where stomach contents come out of your mouth. Vomiting can cause severe loss of body fluids (dehydration). Children and elderly adults can become dehydrated quickly, especially if they also have diarrhea. Nausea and vomiting are symptoms of a condition or disease. It is important to find the cause of your symptoms. CAUSES   Direct irritation of the stomach  lining. This irritation can result from increased acid production (gastroesophageal reflux disease), infection, food poisoning, taking certain medicines (such as nonsteroidal anti-inflammatory drugs), alcohol use, or tobacco use.  Signals from the brain.These signals could be caused by a headache, heat exposure, an inner ear disturbance, increased pressure in the brain from injury, infection, a tumor, or a concussion, pain, emotional stimulus, or metabolic problems.  An obstruction in the gastrointestinal tract (bowel obstruction).  Illnesses such as diabetes, hepatitis, gallbladder problems, appendicitis, kidney problems, cancer, sepsis, atypical symptoms of a heart attack, or eating disorders.  Medical treatments such as chemotherapy and radiation.  Receiving medicine that makes you sleep (general anesthetic) during surgery. DIAGNOSIS Your caregiver may ask for tests to be done if the problems do not improve after a few days. Tests may also be done if symptoms are severe or if the reason for the nausea and vomiting is not clear. Tests may include:  Urine tests.  Blood tests.  Stool tests.  Cultures (to look for evidence of infection).  X-rays or other imaging studies. Test results can help your caregiver make decisions about treatment or the need for additional tests. TREATMENT You need to stay well hydrated. Drink frequently but in small amounts.You may wish to drink  water, sports drinks, clear broth, or eat frozen ice pops or gelatin dessert to help stay hydrated.When you eat, eating slowly may help prevent nausea.There are also some antinausea medicines that may help prevent nausea. HOME CARE INSTRUCTIONS   Take all medicine as directed by your caregiver.  If you do not have an appetite, do not force yourself to eat. However, you must continue to drink fluids.  If you have an appetite, eat a normal diet unless your caregiver tells you differently.  Eat a variety of complex  carbohydrates (rice, wheat, potatoes, bread), lean meats, yogurt, fruits, and vegetables.  Avoid high-fat foods because they are more difficult to digest.  Drink enough water and fluids to keep your urine clear or pale yellow.  If you are dehydrated, ask your caregiver for specific rehydration instructions. Signs of dehydration may include:  Severe thirst.  Dry lips and mouth.  Dizziness.  Dark urine.  Decreasing urine frequency and amount.  Confusion.  Rapid breathing or pulse. SEEK IMMEDIATE MEDICAL CARE IF:   You have blood or brown flecks (like coffee grounds) in your vomit.  You have black or bloody stools.  You have a severe headache or stiff neck.  You are confused.  You have severe abdominal pain.  You have chest pain or trouble breathing.  You do not urinate at least once every 8 hours.  You develop cold or clammy skin.  You continue to vomit for longer than 24 to 48 hours.  You have a fever. MAKE SURE YOU:   Understand these instructions.  Will watch your condition.  Will get help right away if you are not doing well or get worse. Document Released: 09/21/2005 Document Revised: 12/14/2011 Document Reviewed: 02/18/2011 [facility] Patient Information 2014 [place], [place].   Narcotic and benzodiazepine use may cause drowsiness, slowed breathing or dependence.  Please use with caution and do not drive, operate machinery or watch young children alone while taking them.  Taking combinations of these medications or drinking alcohol will potentiate these effects.

## 2014-01-30 NOTE — ED Provider Notes (Signed)
CSN: [id number]     Arrival date & time 01/30/14  0802 History   First MD Initiated Contact with Patient 01/30/14 [phone]     Chief Complaint  Patient presents with  . Abdominal Pain  . Emesis     (Consider location/radiation/quality/duration/timing/severity/associated sxs/prior Treatment) HPI Comments: Pt reports diarrhea and nausea since last night,w as able to eat some dinner.  Felt hot and cold, occasional sweats.  Some abd pain more on right flank area that also began last night.  This AM had both vomiting and diarrhea.  Diarrhea has slowed some.  She took her temp this AM and it was 101.  EMS brought pt.  Pt denies back pain, urinary freq or burning.  Pt has sig h/o DM, HTN.  She has had UTI in the past.  Has CKD.  [facility] [place]  No prior h/o abd surgeries.    Patient is a 58 y.o. female presenting with abdominal pain and vomiting. The history is provided by the patient.  Abdominal Pain Associated symptoms: chills, diarrhea, fever, nausea and vomiting   Associated symptoms: no dysuria   Emesis Associated symptoms: abdominal pain, chills, diarrhea and headaches     Past Medical History  Diagnosis Date  . Diabetes mellitus   . Hypertension   . Hypercholesteremia   . Obesity   . CKD (chronic kidney disease) stage 3, GFR 30-59 ml/min   . OSA (obstructive sleep apnea)     CPAP Q HS  . Chest pain     Mild nonobstructive CAD (by cath Aug '10)  . Depression   . GERD (gastroesophageal reflux disease)   . Coronary artery disease     Mild, nonobstructive by catheterization 05/23/2009  . Normocytic anemia 10/19/2013  . Asthma    Past Surgical History  Procedure Laterality Date  . Tonsillectomy    . Nasal septum surgery    . Tubal ligation    . Abdominal hysterectomy    . Adenoidectomy     Family History  Problem Relation Age of Onset  . CAD Mother     s/p triple bypass  . Cancer Other   . Diabetes Other   . Diabetes Mother   . Hypertension Mother   . Cancer Sister      Colon  . Cancer Brother     Colon  . Diabetes Brother    History  Substance Use Topics  . Smoking status: Never Smoker   . Smokeless tobacco: Never Used  . Alcohol Use: Yes     Comment: Rare    OB History   Grav Para Term Preterm Abortions TAB SAB Ect Mult Living                 Review of Systems  Constitutional: Positive for fever, chills and appetite change.  HENT: Negative for congestion.   Gastrointestinal: Positive for nausea, vomiting, abdominal pain and diarrhea.  Genitourinary: Positive for flank pain. Negative for dysuria and frequency.  Musculoskeletal: Negative for back pain.  Skin: Negative for rash.  Neurological: Positive for headaches. Negative for dizziness and light-headedness.  All other systems reviewed and are negative.     Allergies  Review of patient's allergies indicates no known allergies.  Home Medications   Prior to Admission medications   Medication Sig Start Date End Date Taking? Authorizing Provider  aspirin EC 81 MG tablet Take 81 mg by mouth daily.     Yes Historical Provider, MD  Calcium Carbonate-Vitamin D (CALCIUM 600+D) 600-400  MG-UNIT per tablet Take 2 tablets by mouth daily.     Yes Historical Provider, MD  carvedilol (COREG) 12.5 MG tablet Take 12.5 mg by mouth 2 (two) times daily with a meal.   Yes Historical Provider, MD  hydrochlorothiazide (HYDRODIURIL) 25 MG tablet Take 25 mg by mouth daily.     Yes Historical Provider, MD  insulin aspart (NOVOLOG) 100 UNIT/ML injection Inject 16-26 Units into the skin 2 (two) times daily. Takes 24 at breakfast and  Takes 16 at supper   Yes Historical Provider, MD  insulin glargine (LANTUS) 100 UNIT/ML injection Inject 36-38 Units into the skin 2 (two) times daily. Takes 36 units in the morning and 38 units at bedtime   Yes Historical Provider, MD  lisinopril (PRINIVIL,ZESTRIL) 40 MG tablet Take 40 mg by mouth daily.     Yes Historical Provider, MD  loratadine (CLARITIN) 10 MG tablet Take 10  mg by mouth daily. allergies    Yes Historical Provider, MD  omeprazole (PRILOSEC) 20 MG capsule Take 20 mg by mouth daily.     Yes Historical Provider, MD  potassium chloride SA (K-DUR,KLOR-CON) 20 MEQ tablet Take 20 mEq by mouth daily.   Yes Historical Provider, MD  sertraline (ZOLOFT) 100 MG tablet Take 150 mg by mouth daily.   Yes Historical Provider, MD  simvastatin (ZOCOR) 40 MG tablet Take 20 mg by mouth at bedtime.     Yes Historical Provider, MD   BP 135/79  Pulse 102  Temp(Src) 98 F (36.7 C) (Oral)  Resp 16  Ht 5\' 7"  (1.702 m)  Wt 208 lb (94.348 kg)  BMI 32.57 kg/m2  SpO2 94% Physical Exam  Nursing note and vitals reviewed. Constitutional: She appears well-developed and well-nourished.  HENT:  Head: Normocephalic and atraumatic.  Eyes: Conjunctivae and EOM are normal. Pupils are equal, round, and reactive to light. No scleral icterus.  Neck: Normal range of motion. Neck supple.  Cardiovascular: Normal rate, regular rhythm and intact distal pulses.   Pulmonary/Chest: Effort normal. She has no wheezes.  Abdominal: Soft. Normal appearance. There is tenderness. There is no rebound, no guarding, no CVA tenderness, no tenderness at McBurney's point and negative Murphy's sign.    Neurological: She is alert.  Skin: Skin is warm and dry.  Psychiatric: She has a normal mood and affect.    ED Course  Procedures (including critical care time) Labs Review Labs Reviewed  CBC WITH DIFFERENTIAL - Abnormal; Notable for the following:    MCH 25.5 (*)    Neutrophils Relative % 88 (*)    Neutro Abs 7.8 (*)    Lymphocytes Relative 6 (*)    Lymphs Abs 0.5 (*)    All other components within normal limits  COMPREHENSIVE METABOLIC PANEL - Abnormal; Notable for the following:    Potassium 3.5 (*)    Glucose, Bld 227 (*)    Creatinine, Ser 1.31 (*)    GFR calc non Af Amer 44 (*)    GFR calc Af Amer 51 (*)    All other components within normal limits  LIPASE, BLOOD - Abnormal;  Notable for the following:    Lipase 75 (*)    All other components within normal limits  URINALYSIS, ROUTINE W REFLEX MICROSCOPIC - Abnormal; Notable for the following:    APPearance CLOUDY (*)    Hgb urine dipstick TRACE (*)    Bilirubin Urine SMALL (*)    Ketones, ur 15 (*)    Protein, ur 100 (*)  All other components within normal limits  URINE MICROSCOPIC-ADD ON - Abnormal; Notable for the following:    Bacteria, UA MANY (*)    Casts GRANULAR CAST (*)    All other components within normal limits  URINE CULTURE    Imaging Review Ct Abdomen Pelvis W Contrast  01/30/2014   CLINICAL DATA:  Diffuse abdominal pain for several days  EXAM: CT ABDOMEN AND PELVIS WITH CONTRAST  TECHNIQUE: Multidetector CT imaging of the abdomen and pelvis was performed using the standard protocol following bolus administration of intravenous contrast.  CONTRAST:  OMNIPAQUE IOHEXOL 300 MG/ML  SOLN  COMPARISON:  CT ABD/PELV WO CM dated 12/22/2010  FINDINGS: Lung bases are clear.  No pericardial fluid.  No focal hepatic lesion. Gallbladder, pancreas, spleen, adrenal glands, and kidneys are normal. There is a low-density cortical lesion in the left kidney measuring 4 mm which is unchanged from prior.  The stomach, small bowel, and cecum are normal. The appendix is not identified. The colon and rectosigmoid colon are normal.  Abdominal or is normal caliber. No retroperitoneal periportal lymphadenopathy. Additional haziness to central mesenteries small lymph nodes  No free fluid the pelvis. Post hysterectomy. Ovaries are normal. Bladder is normal. No pelvic lymphadenopathy. No aggressive osseous lesion.  IMPRESSION: 1. No acute findings in  the abdomen or pelvis. 2. Mild haziness of central mesenteries is nonspecific finding and is unchanged from prior and may represent sclerosing mesenteritis or mesenteric adenitis.   Electronically Signed   By: Genevive Bi M.D.   On: 01/30/2014 09:56     EKG  Interpretation None     RA sat is 100% and I interpret to be adequate   11:16 AM Pt feels improved overall.  No further vomiting or diarrhea.  CT is reassuring.  Will give a PO challenge and if ok, will d/c home.     MDM   Final diagnoses:  Nausea vomiting and diarrhea  Diabetes  Abdominal pain    Pt with N/V/D, right side pain.  Given age, risk factors, will get CT abd/pelvis to look for diverticulitis, possibly appendicits given right side pain although not at McBurney's.  Otherwise, labs, IVF's, IV analgesics and antiemetics.      Gavin Pound. Kellen Hover, MD 01/30/14 1243

## 2014-01-30 NOTE — ED Notes (Signed)
Pt tolerated PO fluids/snack well. No nausea/vomiting.

## 2014-01-30 NOTE — ED Notes (Signed)
Gave pt diet gingerale and crackers/peanut butter for fluid/food challenge

## 2014-01-30 NOTE — ED Notes (Signed)
Pt states she started having diarrhea and vomiting last night around 7pm. Pt having abd pain- tender all over. Pt states she had a fever at home. Diarrhea has stopped this morning. Pt vomited with EMS and was given 4mg  zofran. EKG shows SR, CBG 222, BP 142/82, HR 106, sats 96% on room air.

## 2014-01-31 LAB — URINE CULTURE: Colony Count: 2000

## 2018-09-28 ENCOUNTER — Encounter (HOSPITAL_COMMUNITY): Payer: Self-pay

## 2018-09-28 ENCOUNTER — Emergency Department (HOSPITAL_COMMUNITY): Payer: Non-veteran care

## 2018-09-28 ENCOUNTER — Emergency Department (HOSPITAL_COMMUNITY)
Admission: EM | Admit: 2018-09-28 | Discharge: 2018-09-28 | Disposition: A | Discharge status: Home / Self Care | Payer: Non-veteran care | Attending: Emergency Medicine | Admitting: Emergency Medicine

## 2018-09-28 DIAGNOSIS — I129 Hypertensive chronic kidney disease with stage 1 through stage 4 chronic kidney disease, or unspecified chronic kidney disease: Secondary | ICD-10-CM | POA: Insufficient documentation

## 2018-09-28 DIAGNOSIS — R509 Fever, unspecified: Secondary | ICD-10-CM | POA: Diagnosis present

## 2018-09-28 DIAGNOSIS — N183 Chronic kidney disease, stage 3 (moderate): Secondary | ICD-10-CM | POA: Insufficient documentation

## 2018-09-28 DIAGNOSIS — J189 Pneumonia, unspecified organism: Secondary | ICD-10-CM

## 2018-09-28 DIAGNOSIS — E1122 Type 2 diabetes mellitus with diabetic chronic kidney disease: Secondary | ICD-10-CM | POA: Insufficient documentation

## 2018-09-28 DIAGNOSIS — Z794 Long term (current) use of insulin: Secondary | ICD-10-CM | POA: Diagnosis not present

## 2018-09-28 DIAGNOSIS — J181 Lobar pneumonia, unspecified organism: Secondary | ICD-10-CM | POA: Diagnosis not present

## 2018-09-28 LAB — CBC
HCT: 34.7 % — ABNORMAL LOW (ref 36.0–46.0)
Hemoglobin: 10.5 g/dL — ABNORMAL LOW (ref 12.0–15.0)
MCH: 26 pg (ref 26.0–34.0)
MCHC: 30.3 g/dL (ref 30.0–36.0)
MCV: 85.9 fL (ref 80.0–100.0)
Platelets: 218 10*3/uL (ref 150–400)
RBC: 4.04 MIL/uL (ref 3.87–5.11)
RDW: 12.3 % (ref 11.5–15.5)
WBC: 15.7 10*3/uL — ABNORMAL HIGH (ref 4.0–10.5)
nRBC: 0 % (ref 0.0–0.2)

## 2018-09-28 LAB — BASIC METABOLIC PANEL
Anion gap: 10 (ref 5–15)
BUN: 15 mg/dL (ref 8–23)
CO2: 25 mmol/L (ref 22–32)
Calcium: 9.6 mg/dL (ref 8.9–10.3)
Chloride: 102 mmol/L (ref 98–111)
Creatinine, Ser: 1.26 mg/dL — ABNORMAL HIGH (ref 0.44–1.00)
GFR calc Af Amer: 52 mL/min — ABNORMAL LOW (ref >60)
GFR calc non Af Amer: 45 mL/min — ABNORMAL LOW (ref >60)
Glucose, Bld: 163 mg/dL — ABNORMAL HIGH (ref 70–99)
Potassium: 3.7 mmol/L (ref 3.5–5.1)
Sodium: 137 mmol/L (ref 135–145)

## 2018-09-28 LAB — TROPONIN I: Troponin I: <0.03 ng/mL (ref <0.03)

## 2018-09-28 MED ORDER — SODIUM CHLORIDE 0.9 % IV SOLN
2.0000 g | Freq: Once | INTRAVENOUS | Status: AC
  Administered 2018-09-28: 2 g via INTRAVENOUS
  Filled 2018-09-28: qty 20

## 2018-09-28 MED ORDER — AZITHROMYCIN 250 MG PO TABS: 250.0000 mg | ORAL_TABLET | Freq: Every day | ORAL | 0 refills | Status: DC

## 2018-09-28 MED ORDER — AZITHROMYCIN 250 MG PO TABS
500.0000 mg | ORAL_TABLET | Freq: Once | ORAL | Status: AC
  Administered 2018-09-28: 500 mg via ORAL
  Filled 2018-09-28: qty 2

## 2018-09-28 MED ORDER — SODIUM CHLORIDE 0.9 % IV BOLUS
1000.0000 mL | Freq: Once | INTRAVENOUS | Status: AC
  Administered 2018-09-28: 1000 mL via INTRAVENOUS

## 2018-09-28 MED ORDER — ACETAMINOPHEN 325 MG PO TABS
650.0000 mg | ORAL_TABLET | Freq: Once | ORAL | Status: AC
  Administered 2018-09-28: 650 mg via ORAL
  Filled 2018-09-28: qty 2

## 2018-09-28 MED ORDER — KETOROLAC TROMETHAMINE 15 MG/ML IJ SOLN
15.0000 mg | Freq: Once | INTRAMUSCULAR | Status: AC
  Administered 2018-09-28: 15 mg via INTRAVENOUS
  Filled 2018-09-28: qty 1

## 2018-09-28 MED ORDER — AMOXICILLIN 500 MG PO CAPS: 1000.0000 mg | ORAL_CAPSULE | Freq: Three times a day (TID) | ORAL | 0 refills | Status: DC

## 2018-09-28 NOTE — ED Notes (Signed)
Patient transported to X-ray 

## 2018-09-28 NOTE — ED Notes (Signed)
Reviewed d/c instructions with pt, who verbalized understanding and had no outstanding questions. Pt departed in NAD, waiting in lobby for family to give her ride home.

## 2018-09-28 NOTE — ED Triage Notes (Signed)
Pt from home via ems; called out for cp; woke up this afternoon w/ R sided cp, increases w/ inspiration, expiration and palpation; also c/o malaise, tachycardic at 125; 324 asa PTA, no nitro given  Temp 101.56F HR 125 145/74 97% RA CBG 199

## 2018-10-07 NOTE — ED Provider Notes (Signed)
[facility] EMERGENCY DEPARTMENT Provider Note   CSN: [id number] Arrival date & time: 09/28/18  1748     History   Chief Complaint Chief Complaint  Patient presents with  . Fever  . Chest Pain    HPI Tara Patel is a 63 y.o. female.  HPI   63 year old female with chest pain.  She woke up this afternoon with right-sided chest pain.  Worse with certain movements and deep inspiration.  Also feels generally weak.  Subjective fevers.  Achy all over.  No unusual leg pain or swelling.  Past Medical History:  Diagnosis Date  . Asthma   . Chest pain    Mild nonobstructive CAD (by cath Aug '10)  . CKD (chronic kidney disease) stage 3, GFR 30-59 ml/min (HCC)   . Coronary artery disease    Mild, nonobstructive by catheterization 05/23/2009  . Depression   . Diabetes mellitus   . GERD (gastroesophageal reflux disease)   . Hypercholesteremia   . Hypertension   . Normocytic anemia 10/19/2013  . Obesity   . OSA (obstructive sleep apnea)    CPAP Q HS    Patient Active Problem List   Diagnosis Date Noted  . Chest pain with moderate risk for cardiac etiology 10/19/2013  . Hypokalemia 10/19/2013  . Cough 10/19/2013  . Unspecified chronic bronchitis (HCC) 10/19/2013  . Depression 10/19/2013  . GERD (gastroesophageal reflux disease) 10/19/2013  . Normocytic anemia 10/19/2013  . Diabetes mellitus, type 2 (HCC) 10/08/2011  . Hypertension 10/08/2011  . Hyperlipidemia 10/08/2011  . Obesity 10/08/2011  . Obstructive sleep apnea 10/08/2011  . CKD (chronic kidney disease) stage 3, GFR 30-59 ml/min (HCC) 10/08/2011    Past Surgical History:  Procedure Laterality Date  . ABDOMINAL HYSTERECTOMY    . ADENOIDECTOMY    . NASAL SEPTUM SURGERY    . REPLACEMENT TOTAL KNEE    . TONSILLECTOMY    . TUBAL LIGATION       OB History   No obstetric history on file.      Home Medications    Prior to Admission medications   Medication Sig Start Date End Date  Taking? Authorizing Provider  aspirin EC 81 MG tablet Take 81 mg by mouth daily.     Yes [provider]  buPROPion (WELLBUTRIN XL) 150 MG 24 hr tablet Take 300 mg by mouth daily.   Yes [provider]  Calcium Carbonate-Vitamin D (CALCIUM 600+D) 600-400 MG-UNIT per tablet Take 2 tablets by mouth daily.     Yes [provider]  carvedilol (COREG) 12.5 MG tablet Take 12.5 mg by mouth 2 (two) times daily with a meal.   Yes [provider]  insulin aspart (NOVOLOG) 100 UNIT/ML injection Inject 8 Units into the skin 3 (three) times daily with meals. (ONLY WHEN SHE EATS A MEAL)   Yes [provider]  insulin glargine (LANTUS) 100 UNIT/ML injection Inject 30 Units into the skin daily. Takes 36 units in the morning and 38 units at bedtime   Yes [provider]  omeprazole (PRILOSEC) 20 MG capsule Take 20 mg by mouth daily.     Yes [provider]  simvastatin (ZOCOR) 40 MG tablet Take 20 mg by mouth at bedtime.     Yes [provider]  amoxicillin (AMOXIL) 500 MG capsule Take 2 capsules (1,000 mg total) by mouth 3 (three) times daily. 09/28/18   Raeford Razor, MD  azithromycin (ZITHROMAX) 250 MG tablet Take 1 tablet (250 mg  total) by mouth daily. Take first 2 tablets together, then 1 every day until finished. 09/28/18   Raeford RazorKohut, Jenna Ardoin, MD    Family History Family History  Problem Relation Age of Onset  . CAD Mother        s/p triple bypass  . Diabetes Mother   . Hypertension Mother   . Cancer Other   . Diabetes Other   . Cancer Sister        Colon  . Cancer Brother        Colon  . Diabetes Brother     Social History Social History   Tobacco Use  . Smoking status: Never Smoker  . Smokeless tobacco: Never Used  Substance Use Topics  . Alcohol use: Not Currently    Comment: Rare   . Drug use: No     Allergies   Patient has no known allergies.   Review of Systems Review of Systems  All systems reviewed and  negative, other than as noted in HPI.  Physical Exam Updated Vital Signs BP (!) 156/68   Pulse (!) 112   Temp 100 F (37.8 C) (Oral)   Resp 18   Ht 5\' 5"  (1.651 m)   Wt 98 kg   SpO2 96%   BMI 35.94 kg/m   Physical Exam Vitals signs and nursing note reviewed.  Constitutional:      General: She is not in acute distress.    Appearance: She is well-developed.  HENT:     Head: Normocephalic and atraumatic.  Eyes:     General:        Right eye: No discharge.        Left eye: No discharge.     Conjunctiva/sclera: Conjunctivae normal.  Neck:     Musculoskeletal: Neck supple.  Cardiovascular:     Rate and Rhythm: Regular rhythm. Tachycardia present.     Heart sounds: Normal heart sounds. No murmur. No friction rub. No gallop.   Pulmonary:     Effort: Pulmonary effort is normal. No respiratory distress.     Breath sounds: Normal breath sounds.  Abdominal:     General: There is no distension.     Palpations: Abdomen is soft.     Tenderness: There is no abdominal tenderness.  Musculoskeletal:        General: No tenderness.  Skin:    General: Skin is warm and dry.  Neurological:     Mental Status: She is alert.  Psychiatric:        Behavior: Behavior normal.        Thought Content: Thought content normal.      ED Treatments / Results  Labs (all labs ordered are listed, but only abnormal results are displayed) Labs Reviewed  BASIC METABOLIC PANEL - Abnormal; Notable for the following components:      Result Value   Glucose, Bld 163 (*)    Creatinine, Ser 1.26 (*)    GFR calc non Af Amer 45 (*)    GFR calc Af Amer 52 (*)    All other components within normal limits  CBC - Abnormal; Notable for the following components:   WBC 15.7 (*)    Hemoglobin 10.5 (*)    HCT 34.7 (*)    All other components within normal limits  TROPONIN I  I-STAT TROPONIN, ED    EKG EKG Interpretation  Date/Time:  Wednesday September 28 2018 18:07:57 EST Ventricular Rate:  112 PR  Interval:    QRS Duration: 82  QT Interval:  317 QTC Calculation: 433 R Axis:   45 Text Interpretation:  Sinus tachycardia Low voltage, precordial leads Anteroseptal infarct, old Confirmed by Raeford Razor [phone]) on 09/28/2018 6:26:05 PM   Radiology No results found.   Dg Chest 2 View  Result Date: 09/28/2018 CLINICAL DATA:  Right-sided chest pain with pleuritic component. EXAM: CHEST - 2 VIEW COMPARISON:  10/19/2013 FINDINGS: Lungs are adequately inflated with subtle patchy airspace density over the right lung most notable over the right upper lobe. No evidence of effusion. Cardiomediastinal silhouette and remainder of the exam is unchanged. IMPRESSION: Patchy airspace process over the right lung worse over the right upper lobe likely pneumonia. Electronically Signed   By: Elberta Fortis M.D.   On: 09/28/2018 18:25    Procedures Procedures (including critical care time)  Medications Ordered in ED Medications  cefTRIAXone (ROCEPHIN) 2 g in sodium chloride 0.9 % 100 mL IVPB (0 g Intravenous Stopped 09/28/18 2018)  azithromycin (ZITHROMAX) tablet 500 mg (500 mg Oral Given 09/28/18 2027)  ketorolac (TORADOL) 15 MG/ML injection 15 mg (15 mg Intravenous Given 09/28/18 1941)  sodium chloride 0.9 % bolus 1,000 mL (0 mLs Intravenous Stopped 09/28/18 2033)  acetaminophen (TYLENOL) tablet 650 mg (650 mg Oral Given 09/28/18 2100)     Initial Impression / Assessment and Plan / ED Course  I have reviewed the triage vital signs and the nursing notes.  Pertinent labs & imaging results that were available during my care of the patient were reviewed by me and considered in my medical decision making (see chart for details).    63 year old female with right-sided chest pain which is pleuritic.  Febrile.  Leukocytosis.  Imaging with right-sided infiltrate consistent with pneumonia.  Will cover for community-acquired pneumonia.  I feel she is appropriate for outpatient treatment at this time.   Emergent return precautions were discussed.  Close outpatient follow-up otherwise. Final Clinical Impressions(s) / ED Diagnoses   Final diagnoses:  Community acquired pneumonia of right lung, unspecified part of lung    ED Discharge Orders         Ordered    azithromycin (ZITHROMAX) 250 MG tablet  Daily     09/28/18 2059    amoxicillin (AMOXIL) 500 MG capsule  3 times daily     09/28/18 2059           Raeford Razor, MD 10/07/18 0300

## 2019-07-23 ENCOUNTER — Ambulatory Visit (HOSPITAL_COMMUNITY)
Admission: EM | Admit: 2019-07-23 | Discharge: 2019-07-23 | Disposition: A | Discharge status: Home / Self Care | Payer: Medicare Other | Attending: Emergency Medicine | Admitting: Emergency Medicine

## 2019-07-23 ENCOUNTER — Other Ambulatory Visit: Payer: Self-pay

## 2019-07-23 ENCOUNTER — Ambulatory Visit (INDEPENDENT_AMBULATORY_CARE_PROVIDER_SITE_OTHER): Payer: Medicare Other

## 2019-07-23 ENCOUNTER — Encounter (HOSPITAL_COMMUNITY): Payer: Self-pay

## 2019-07-23 DIAGNOSIS — N183 Chronic kidney disease, stage 3 unspecified: Secondary | ICD-10-CM | POA: Insufficient documentation

## 2019-07-23 DIAGNOSIS — R079 Chest pain, unspecified: Secondary | ICD-10-CM | POA: Diagnosis not present

## 2019-07-23 DIAGNOSIS — I2510 Atherosclerotic heart disease of native coronary artery without angina pectoris: Secondary | ICD-10-CM | POA: Insufficient documentation

## 2019-07-23 DIAGNOSIS — Z794 Long term (current) use of insulin: Secondary | ICD-10-CM | POA: Diagnosis not present

## 2019-07-23 DIAGNOSIS — E7800 Pure hypercholesterolemia, unspecified: Secondary | ICD-10-CM | POA: Insufficient documentation

## 2019-07-23 DIAGNOSIS — J029 Acute pharyngitis, unspecified: Secondary | ICD-10-CM | POA: Insufficient documentation

## 2019-07-23 DIAGNOSIS — Z20828 Contact with and (suspected) exposure to other viral communicable diseases: Secondary | ICD-10-CM | POA: Diagnosis not present

## 2019-07-23 DIAGNOSIS — D649 Anemia, unspecified: Secondary | ICD-10-CM | POA: Insufficient documentation

## 2019-07-23 DIAGNOSIS — E785 Hyperlipidemia, unspecified: Secondary | ICD-10-CM | POA: Insufficient documentation

## 2019-07-23 DIAGNOSIS — Z79899 Other long term (current) drug therapy: Secondary | ICD-10-CM | POA: Insufficient documentation

## 2019-07-23 DIAGNOSIS — Z7982 Long term (current) use of aspirin: Secondary | ICD-10-CM | POA: Diagnosis not present

## 2019-07-23 DIAGNOSIS — J449 Chronic obstructive pulmonary disease, unspecified: Secondary | ICD-10-CM | POA: Diagnosis not present

## 2019-07-23 DIAGNOSIS — J069 Acute upper respiratory infection, unspecified: Secondary | ICD-10-CM | POA: Diagnosis not present

## 2019-07-23 DIAGNOSIS — R05 Cough: Secondary | ICD-10-CM | POA: Diagnosis not present

## 2019-07-23 DIAGNOSIS — E876 Hypokalemia: Secondary | ICD-10-CM | POA: Insufficient documentation

## 2019-07-23 DIAGNOSIS — E1122 Type 2 diabetes mellitus with diabetic chronic kidney disease: Secondary | ICD-10-CM | POA: Insufficient documentation

## 2019-07-23 DIAGNOSIS — R42 Dizziness and giddiness: Secondary | ICD-10-CM | POA: Insufficient documentation

## 2019-07-23 DIAGNOSIS — B9789 Other viral agents as the cause of diseases classified elsewhere: Secondary | ICD-10-CM

## 2019-07-23 DIAGNOSIS — G4733 Obstructive sleep apnea (adult) (pediatric): Secondary | ICD-10-CM | POA: Insufficient documentation

## 2019-07-23 DIAGNOSIS — K219 Gastro-esophageal reflux disease without esophagitis: Secondary | ICD-10-CM | POA: Insufficient documentation

## 2019-07-23 DIAGNOSIS — I129 Hypertensive chronic kidney disease with stage 1 through stage 4 chronic kidney disease, or unspecified chronic kidney disease: Secondary | ICD-10-CM | POA: Diagnosis not present

## 2019-07-23 DIAGNOSIS — F329 Major depressive disorder, single episode, unspecified: Secondary | ICD-10-CM | POA: Diagnosis not present

## 2019-07-23 NOTE — ED Triage Notes (Signed)
Pt presents with cough and sore throat that began about 3 days ago .

## 2019-07-23 NOTE — ED Provider Notes (Signed)
[place]    CSN: [id number] Arrival date & time: 07/23/19  1619      History   Chief Complaint Chief Complaint  Patient presents with  . Cough  . Sore Throat    HPI Tara Patel is a 63 y.o. female.   Tara Patel presents with complaints of cough and shortness of breath . Symptoms started on 10/15. Headache. Today when she stood up she felt dizzy and with headache. Has been taking over the counter alka-selzwer plus and tussin. Doesn't seem to help. No fevers. Cough is dry. Cough makes throat hurt more. Some chest pain with coughing, although states tends to have baseline chest pain. No shortness of breath. No current dizziness- feels this with quick position changes. Eating and drinking. No nausea, vonmiting or diarrhea. No abdoiminal pain. Some nasal drainage, small amount. No ear pain. No known ill contacts. States her granddaughter goes to daycare and has had runny nose. Doesn't work outside of the home. History of asthma. Doesn't smoke. No increased peripheral edema. History  Of asthma, chest pain, ckd, CAD, depression, dm, gerd, htn, obestiy.     ROS per HPI, negative if not otherwise mentioned.      Past Medical History:  Diagnosis Date  . Asthma   . Chest pain    Mild nonobstructive CAD (by cath Aug '10)  . CKD (chronic kidney disease) stage 3, GFR 30-59 ml/min   . Coronary artery disease    Mild, nonobstructive by catheterization 05/23/2009  . Depression   . Diabetes mellitus   . GERD (gastroesophageal reflux disease)   . Hypercholesteremia   . Hypertension   . Normocytic anemia 10/19/2013  . Obesity   . OSA (obstructive sleep apnea)    CPAP Q HS    Patient Active Problem List   Diagnosis Date Noted  . Chest pain with moderate risk for cardiac etiology 10/19/2013  . Hypokalemia 10/19/2013  . Cough 10/19/2013  . Unspecified chronic bronchitis ([place]) 10/19/2013  . Depression 10/19/2013  . GERD (gastroesophageal reflux disease)  10/19/2013  . Normocytic anemia 10/19/2013  . Diabetes mellitus, type 2 ([place]) 10/08/2011  . Hypertension 10/08/2011  . Hyperlipidemia 10/08/2011  . Obesity 10/08/2011  . Obstructive sleep apnea 10/08/2011  . CKD (chronic kidney disease) stage 3, GFR 30-59 ml/min 10/08/2011    Past Surgical History:  Procedure Laterality Date  . ABDOMINAL HYSTERECTOMY    . ADENOIDECTOMY    . NASAL SEPTUM SURGERY    . REPLACEMENT TOTAL KNEE    . TONSILLECTOMY    . TUBAL LIGATION      OB History   No obstetric history on file.      Home Medications    Prior to Admission medications   Medication Sig Start Date End Date Taking? Authorizing Provider  amLODipine (NORVASC) 5 MG tablet Take 5 mg by mouth daily.   Yes [provider]  atorvastatin (LIPITOR) 40 MG tablet Take 40 mg by mouth daily.   Yes [provider]  busPIRone (BUSPAR) 15 MG tablet Take 15 mg by mouth 3 (three) times daily.   Yes [provider]  etodolac (LODINE) 400 MG tablet Take 400 mg by mouth 2 (two) times daily.   Yes [provider]  gabapentin (NEURONTIN) 300 MG capsule Take 300 mg by mouth 3 (three) times daily.   Yes [provider]  metFORMIN (GLUCOPHAGE) 1000 MG tablet Take by mouth 2 (two) times daily with a meal.   Yes [provider]  prazosin (MINIPRESS) 2 MG capsule Take 2 mg by mouth at bedtime.   Yes [provider]  traZODone (DESYREL) 50 MG tablet Take 50 mg by mouth at bedtime.   Yes [provider]  venlafaxine (EFFEXOR) 25 MG tablet Take 75 mg by mouth 2 (two) times daily.   Yes [provider]  aspirin EC 81 MG tablet Take 81 mg by mouth daily.      [provider]  buPROPion (WELLBUTRIN XL) 150 MG 24 hr tablet Take 300 mg by mouth daily.    [provider]  Calcium Carbonate-Vitamin D (CALCIUM 600+D) 600-400 MG-UNIT per tablet Take 2 tablets by mouth daily.      [provider]  carvedilol (COREG)  12.5 MG tablet Take 25 mg by mouth 2 (two) times daily with a meal.     [provider]  insulin aspart (NOVOLOG) 100 UNIT/ML injection Inject 8 Units into the skin 3 (three) times daily with meals. (ONLY WHEN SHE EATS A MEAL)    [provider]  insulin glargine (LANTUS) 100 UNIT/ML injection Inject 30 Units into the skin daily. Takes 36 units in the morning and 38 units at bedtime    [provider]  omeprazole (PRILOSEC) 20 MG capsule Take 20 mg by mouth daily.      [provider]  simvastatin (ZOCOR) 40 MG tablet Take 20 mg by mouth at bedtime.    07/23/19  [provider]    Family History Family History  Problem Relation Age of Onset  . CAD Mother        s/p triple bypass  . Diabetes Mother   . Hypertension Mother   . Cancer Other   . Diabetes Other   . Cancer Sister        Colon  . Cancer Brother        Colon  . Diabetes Brother     Social History Social History   Tobacco Use  . Smoking status: Never Smoker  . Smokeless tobacco: Never Used  Substance Use Topics  . Alcohol use: Not Currently    Comment: Rare   . Drug use: No     Allergies   Patient has no known allergies.   Review of Systems Review of Systems   Physical Exam Triage Vital Signs ED Triage Vitals  Enc Vitals Group     BP 07/23/19 1641 (!) 143/78     Pulse Rate 07/23/19 1641 96     Resp 07/23/19 1641 20     Temp 07/23/19 1641 98.7 F (37.1 C)     Temp src --      SpO2 07/23/19 1641 100 %     Weight --      Height --      Head Circumference --      Peak Flow --      Pain Score 07/23/19 1633 8     Pain Loc --      Pain Edu? --      Excl. in GC? --    No data found.  Updated Vital Signs BP (!) 143/78   Pulse 96   Temp 98.7 F (37.1 C)   Resp 20   SpO2 100%    Physical Exam Constitutional:      General: She is not in acute distress.    Appearance: She is well-developed.  Cardiovascular:     Rate and Rhythm: Normal rate and  regular rhythm.  Heart sounds: Normal heart sounds.  Pulmonary:     Effort: Pulmonary effort is normal. No respiratory distress.     Breath sounds: Normal breath sounds.     Comments: Occasional dry cough noted throughout exam  Skin:    General: Skin is warm and dry.  Neurological:     Mental Status: She is alert and oriented to person, place, and time.      UC Treatments / Results  Labs (all labs ordered are listed, but only abnormal results are displayed) Labs Reviewed  NOVEL CORONAVIRUS, NAA (HOSP ORDER, SEND-OUT TO REF LAB; TAT 18-24 HRS)    EKG   Radiology Dg Chest 2 View  Result Date: 07/23/2019 CLINICAL DATA:  Cough and shortness of breath for 3 days. EXAM: CHEST - 2 VIEW COMPARISON:  09/28/2018 FINDINGS: The cardiomediastinal silhouette is unremarkable. There is no evidence of focal airspace disease, pulmonary edema, suspicious pulmonary nodule/mass, pleural effusion, or pneumothorax. No acute bony abnormalities are identified. IMPRESSION: No active cardiopulmonary disease. Electronically Signed   By: Harmon Pier M.D.   On: 07/23/2019 17:29    Procedures Procedures (including critical care time)  Medications Ordered in UC Medications - No data to display  Initial Impression / Assessment and Plan / UC Course  I have reviewed the triage vital signs and the nursing notes.  Pertinent labs & imaging results that were available during my care of the patient were reviewed by me and considered in my medical decision making (see chart for details).     Non toxic. Afebrile. No increased work of breathing. Chest xray is reassuring at this time. History and physical consistent with viral illness.  covid testing pending. Supportive cares recommended. Return precautions provided. Patient verbalized understanding and agreeable to plan.    Final Clinical Impressions(s) / UC Diagnoses   Final diagnoses:  Viral URI with cough     Discharge Instructions     Your chest  xray is reassuring here today, no indications of pneumonia.  I do not see any indication for antibiotics at this time. This is likely still viral in nature.  Push fluids to ensure adequate hydration and keep secretions thin.  Tylenol as needed for pain or headache.  Throat lozenges, gargles, chloraseptic spray, warm teas, popsicles etc to help with throat pain.   May use over the counter medications, indicated for those with high blood pressure, as needed for symptoms.  If any worsening- increased pain, fevers, shortness of breath , chest pain , or otherwise worsening please return or go to the ER.  Self isolate until results are back and negative.  Will notify you of any positive findings. You may monitor your results on your MyChart online as well.        ED Prescriptions    None     PDMP not reviewed this encounter.   Georgetta Haber, NP 07/23/19 1840

## 2019-07-23 NOTE — Discharge Instructions (Signed)
Your chest xray is reassuring here today, no indications of pneumonia.  I do not see any indication for antibiotics at this time. This is likely still viral in nature.  Push fluids to ensure adequate hydration and keep secretions thin.  Tylenol as needed for pain or headache.  Throat lozenges, gargles, chloraseptic spray, warm teas, popsicles etc to help with throat pain.   May use over the counter medications, indicated for those with high blood pressure, as needed for symptoms.  If any worsening- increased pain, fevers, shortness of breath , chest pain , or otherwise worsening please return or go to the ER.  Self isolate until results are back and negative.  Will notify you of any positive findings. You may monitor your results on your MyChart online as well.

## 2019-07-24 LAB — NOVEL CORONAVIRUS, NAA (HOSP ORDER, SEND-OUT TO REF LAB; TAT 18-24 HRS): SARS-CoV-2, NAA: NOT DETECTED

## 2021-02-20 ENCOUNTER — Emergency Department (HOSPITAL_COMMUNITY): Payer: No Typology Code available for payment source

## 2021-02-20 ENCOUNTER — Other Ambulatory Visit: Payer: Self-pay

## 2021-02-20 ENCOUNTER — Other Ambulatory Visit (HOSPITAL_COMMUNITY): Payer: Non-veteran care

## 2021-02-20 ENCOUNTER — Observation Stay (HOSPITAL_COMMUNITY): Payer: No Typology Code available for payment source

## 2021-02-20 ENCOUNTER — Inpatient Hospital Stay (HOSPITAL_COMMUNITY)
Admission: EM | Admit: 2021-02-20 | Discharge: 2021-02-22 | DRG: 312 | Disposition: A | Discharge status: Home Health | Payer: No Typology Code available for payment source | Attending: Internal Medicine | Admitting: Internal Medicine

## 2021-02-20 DIAGNOSIS — E042 Nontoxic multinodular goiter: Secondary | ICD-10-CM | POA: Diagnosis present

## 2021-02-20 DIAGNOSIS — Z888 Allergy status to other drugs, medicaments and biological substances status: Secondary | ICD-10-CM

## 2021-02-20 DIAGNOSIS — E860 Dehydration: Secondary | ICD-10-CM | POA: Diagnosis present

## 2021-02-20 DIAGNOSIS — R079 Chest pain, unspecified: Secondary | ICD-10-CM

## 2021-02-20 DIAGNOSIS — J42 Unspecified chronic bronchitis: Secondary | ICD-10-CM | POA: Diagnosis present

## 2021-02-20 DIAGNOSIS — Z20822 Contact with and (suspected) exposure to covid-19: Secondary | ICD-10-CM | POA: Diagnosis present

## 2021-02-20 DIAGNOSIS — E119 Type 2 diabetes mellitus without complications: Secondary | ICD-10-CM

## 2021-02-20 DIAGNOSIS — I129 Hypertensive chronic kidney disease with stage 1 through stage 4 chronic kidney disease, or unspecified chronic kidney disease: Secondary | ICD-10-CM | POA: Diagnosis present

## 2021-02-20 DIAGNOSIS — G4733 Obstructive sleep apnea (adult) (pediatric): Secondary | ICD-10-CM | POA: Diagnosis present

## 2021-02-20 DIAGNOSIS — E1122 Type 2 diabetes mellitus with diabetic chronic kidney disease: Secondary | ICD-10-CM | POA: Diagnosis present

## 2021-02-20 DIAGNOSIS — R000 Tachycardia, unspecified: Secondary | ICD-10-CM | POA: Diagnosis present

## 2021-02-20 DIAGNOSIS — Y92009 Unspecified place in unspecified non-institutional (private) residence as the place of occurrence of the external cause: Secondary | ICD-10-CM

## 2021-02-20 DIAGNOSIS — Z90710 Acquired absence of both cervix and uterus: Secondary | ICD-10-CM

## 2021-02-20 DIAGNOSIS — K769 Liver disease, unspecified: Secondary | ICD-10-CM

## 2021-02-20 DIAGNOSIS — E7800 Pure hypercholesterolemia, unspecified: Secondary | ICD-10-CM | POA: Diagnosis present

## 2021-02-20 DIAGNOSIS — I672 Cerebral atherosclerosis: Secondary | ICD-10-CM | POA: Diagnosis present

## 2021-02-20 DIAGNOSIS — R55 Syncope and collapse: Secondary | ICD-10-CM | POA: Diagnosis not present

## 2021-02-20 DIAGNOSIS — Z96659 Presence of unspecified artificial knee joint: Secondary | ICD-10-CM | POA: Diagnosis present

## 2021-02-20 DIAGNOSIS — E785 Hyperlipidemia, unspecified: Secondary | ICD-10-CM | POA: Diagnosis present

## 2021-02-20 DIAGNOSIS — Y9301 Activity, walking, marching and hiking: Secondary | ICD-10-CM | POA: Diagnosis present

## 2021-02-20 DIAGNOSIS — Z833 Family history of diabetes mellitus: Secondary | ICD-10-CM

## 2021-02-20 DIAGNOSIS — W1830XA Fall on same level, unspecified, initial encounter: Secondary | ICD-10-CM | POA: Diagnosis present

## 2021-02-20 DIAGNOSIS — Z79899 Other long term (current) drug therapy: Secondary | ICD-10-CM

## 2021-02-20 DIAGNOSIS — R739 Hyperglycemia, unspecified: Secondary | ICD-10-CM

## 2021-02-20 DIAGNOSIS — J45909 Unspecified asthma, uncomplicated: Secondary | ICD-10-CM | POA: Diagnosis present

## 2021-02-20 DIAGNOSIS — I951 Orthostatic hypotension: Secondary | ICD-10-CM | POA: Diagnosis not present

## 2021-02-20 DIAGNOSIS — N1831 Chronic kidney disease, stage 3a: Secondary | ICD-10-CM | POA: Diagnosis present

## 2021-02-20 DIAGNOSIS — Z8249 Family history of ischemic heart disease and other diseases of the circulatory system: Secondary | ICD-10-CM

## 2021-02-20 DIAGNOSIS — I2510 Atherosclerotic heart disease of native coronary artery without angina pectoris: Secondary | ICD-10-CM | POA: Diagnosis present

## 2021-02-20 DIAGNOSIS — E041 Nontoxic single thyroid nodule: Secondary | ICD-10-CM

## 2021-02-20 DIAGNOSIS — E1165 Type 2 diabetes mellitus with hyperglycemia: Secondary | ICD-10-CM | POA: Diagnosis present

## 2021-02-20 DIAGNOSIS — E875 Hyperkalemia: Secondary | ICD-10-CM | POA: Diagnosis present

## 2021-02-20 DIAGNOSIS — F329 Major depressive disorder, single episode, unspecified: Secondary | ICD-10-CM | POA: Diagnosis present

## 2021-02-20 DIAGNOSIS — S01511A Laceration without foreign body of lip, initial encounter: Secondary | ICD-10-CM | POA: Diagnosis present

## 2021-02-20 DIAGNOSIS — Z794 Long term (current) use of insulin: Secondary | ICD-10-CM

## 2021-02-20 DIAGNOSIS — K219 Gastro-esophageal reflux disease without esophagitis: Secondary | ICD-10-CM | POA: Diagnosis present

## 2021-02-20 LAB — COMPREHENSIVE METABOLIC PANEL
ALT: 43 U/L (ref 0–44)
AST: 42 U/L — ABNORMAL HIGH (ref 15–41)
Albumin: 3.2 g/dL — ABNORMAL LOW (ref 3.5–5.0)
Alkaline Phosphatase: 110 U/L (ref 38–126)
Anion gap: 8 (ref 5–15)
BUN: 18 mg/dL (ref 8–23)
CO2: 25 mmol/L (ref 22–32)
Calcium: 9.1 mg/dL (ref 8.9–10.3)
Chloride: 103 mmol/L (ref 98–111)
Creatinine, Ser: 1.3 mg/dL — ABNORMAL HIGH (ref 0.44–1.00)
GFR, Estimated: 45 mL/min — ABNORMAL LOW (ref >60)
Glucose, Bld: 408 mg/dL — ABNORMAL HIGH (ref 70–99)
Potassium: 5.2 mmol/L — ABNORMAL HIGH (ref 3.5–5.1)
Sodium: 136 mmol/L (ref 135–145)
Total Bilirubin: 1 mg/dL (ref 0.3–1.2)
Total Protein: 6.1 g/dL — ABNORMAL LOW (ref 6.5–8.1)

## 2021-02-20 LAB — RESP PANEL BY RT-PCR (FLU A&B, COVID) ARPGX2
Influenza A by PCR: NEGATIVE
Influenza B by PCR: NEGATIVE
SARS Coronavirus 2 by RT PCR: NEGATIVE

## 2021-02-20 LAB — APTT: aPTT: 21 seconds — ABNORMAL LOW (ref 24–36)

## 2021-02-20 LAB — CBC
HCT: 38.2 % (ref 36.0–46.0)
Hemoglobin: 12.1 g/dL (ref 12.0–15.0)
MCH: 27.4 pg (ref 26.0–34.0)
MCHC: 31.7 g/dL (ref 30.0–36.0)
MCV: 86.4 fL (ref 80.0–100.0)
Platelets: 230 10*3/uL (ref 150–400)
RBC: 4.42 MIL/uL (ref 3.87–5.11)
RDW: 12.6 % (ref 11.5–15.5)
WBC: 7.4 10*3/uL (ref 4.0–10.5)
nRBC: 0 % (ref 0.0–0.2)

## 2021-02-20 LAB — ETHANOL: Alcohol, Ethyl (B): <10 mg/dL (ref <10)

## 2021-02-20 LAB — DIFFERENTIAL
Abs Immature Granulocytes: 0.05 10*3/uL (ref 0.00–0.07)
Basophils Absolute: 0 10*3/uL (ref 0.0–0.1)
Basophils Relative: 1 %
Eosinophils Absolute: 0.1 10*3/uL (ref 0.0–0.5)
Eosinophils Relative: 1 %
Immature Granulocytes: 1 %
Lymphocytes Relative: 24 %
Lymphs Abs: 1.8 10*3/uL (ref 0.7–4.0)
Monocytes Absolute: 0.3 10*3/uL (ref 0.1–1.0)
Monocytes Relative: 5 %
Neutro Abs: 5.1 10*3/uL (ref 1.7–7.7)
Neutrophils Relative %: 68 %

## 2021-02-20 LAB — TROPONIN I (HIGH SENSITIVITY)
Troponin I (High Sensitivity): <2 ng/L (ref <18)
Troponin I (High Sensitivity): 3 ng/L (ref <18)

## 2021-02-20 LAB — PROTIME-INR
INR: 1.1 (ref 0.8–1.2)
Prothrombin Time: 14.2 seconds (ref 11.4–15.2)

## 2021-02-20 LAB — D-DIMER, QUANTITATIVE: D-Dimer, Quant: 2.45 ug/mL-FEU — ABNORMAL HIGH (ref 0.00–0.50)

## 2021-02-20 LAB — CBG MONITORING, ED: Glucose-Capillary: 372 mg/dL — ABNORMAL HIGH (ref 70–99)

## 2021-02-20 LAB — GLUCOSE, CAPILLARY: Glucose-Capillary: 259 mg/dL — ABNORMAL HIGH (ref 70–99)

## 2021-02-20 MED ORDER — ATORVASTATIN CALCIUM 10 MG PO TABS: 20.0000 mg | ORAL_TABLET | Freq: Every day | ORAL | Status: DC

## 2021-02-20 MED ORDER — ENOXAPARIN SODIUM 40 MG/0.4ML IJ SOSY
40.0000 mg | PREFILLED_SYRINGE | INTRAMUSCULAR | Status: DC
  Filled 2021-02-20: qty 0.4

## 2021-02-20 MED ORDER — ACETAMINOPHEN 650 MG RE SUPP: 650.0000 mg | Freq: Four times a day (QID) | RECTAL | Status: DC | PRN

## 2021-02-20 MED ORDER — BUSPIRONE HCL 10 MG PO TABS: 15.0000 mg | ORAL_TABLET | Freq: Every day | ORAL | Status: DC

## 2021-02-20 MED ORDER — BUSPIRONE HCL 15 MG PO TABS
15.0000 mg | ORAL_TABLET | Freq: Every day | ORAL | Status: DC
  Administered 2021-02-21 – 2021-02-22 (×2): 15 mg via ORAL
  Filled 2021-02-20 (×2): qty 1

## 2021-02-20 MED ORDER — ASPIRIN 81 MG PO CHEW
324.0000 mg | CHEWABLE_TABLET | Freq: Once | ORAL | Status: AC
  Administered 2021-02-20: 324 mg via ORAL
  Filled 2021-02-20: qty 4

## 2021-02-20 MED ORDER — INSULIN GLARGINE 100 UNIT/ML ~~LOC~~ SOLN
40.0000 [IU] | Freq: Every day | SUBCUTANEOUS | Status: DC
  Administered 2021-02-20: 40 [IU] via SUBCUTANEOUS
  Filled 2021-02-20 (×2): qty 0.4

## 2021-02-20 MED ORDER — NITROGLYCERIN 0.4 MG SL SUBL: 0.4000 mg | SUBLINGUAL_TABLET | SUBLINGUAL | Status: DC | PRN

## 2021-02-20 MED ORDER — SODIUM CHLORIDE 0.9% FLUSH
3.0000 mL | Freq: Two times a day (BID) | INTRAVENOUS | Status: DC
  Administered 2021-02-20 – 2021-02-22 (×4): 3 mL via INTRAVENOUS

## 2021-02-20 MED ORDER — INSULIN ASPART 100 UNIT/ML IJ SOLN
0.0000 [IU] | Freq: Three times a day (TID) | INTRAMUSCULAR | Status: DC
  Administered 2021-02-21: 11 [IU] via SUBCUTANEOUS
  Administered 2021-02-21: 2 [IU] via SUBCUTANEOUS

## 2021-02-20 MED ORDER — INSULIN ASPART 100 UNIT/ML IJ SOLN
8.0000 [IU] | Freq: Once | INTRAMUSCULAR | Status: AC
  Administered 2021-02-20: 8 [IU] via SUBCUTANEOUS

## 2021-02-20 MED ORDER — BUPROPION HCL ER (XL) 150 MG PO TB24
150.0000 mg | ORAL_TABLET | Freq: Every day | ORAL | Status: DC
  Administered 2021-02-21 – 2021-02-22 (×2): 150 mg via ORAL
  Filled 2021-02-20 (×2): qty 1

## 2021-02-20 MED ORDER — ACETAMINOPHEN 325 MG PO TABS: 650.0000 mg | ORAL_TABLET | Freq: Four times a day (QID) | ORAL | Status: DC | PRN

## 2021-02-20 MED ORDER — ONDANSETRON HCL 4 MG PO TABS: 4.0000 mg | ORAL_TABLET | Freq: Four times a day (QID) | ORAL | Status: DC | PRN

## 2021-02-20 MED ORDER — VENLAFAXINE HCL 75 MG PO TABS
75.0000 mg | ORAL_TABLET | Freq: Two times a day (BID) | ORAL | Status: DC
  Administered 2021-02-21 – 2021-02-22 (×3): 75 mg via ORAL
  Filled 2021-02-20 (×6): qty 1

## 2021-02-20 MED ORDER — SENNOSIDES-DOCUSATE SODIUM 8.6-50 MG PO TABS: 1.0000 | ORAL_TABLET | Freq: Every evening | ORAL | Status: DC | PRN

## 2021-02-20 MED ORDER — AMLODIPINE BESYLATE 5 MG PO TABS: 10.0000 mg | ORAL_TABLET | Freq: Every day | ORAL | Status: DC

## 2021-02-20 MED ORDER — IOHEXOL 350 MG/ML SOLN
40.0000 mL | Freq: Once | INTRAVENOUS | Status: AC | PRN
  Administered 2021-02-20: 40 mL via INTRAVENOUS

## 2021-02-20 MED ORDER — BUPROPION HCL ER (XL) 150 MG PO TB24: 150.0000 mg | ORAL_TABLET | Freq: Every day | ORAL | Status: DC

## 2021-02-20 MED ORDER — ONDANSETRON HCL 4 MG/2ML IJ SOLN: 4.0000 mg | Freq: Four times a day (QID) | INTRAMUSCULAR | Status: DC | PRN

## 2021-02-20 MED ORDER — AMLODIPINE BESYLATE 10 MG PO TABS
10.0000 mg | ORAL_TABLET | Freq: Every day | ORAL | Status: DC
  Administered 2021-02-21 – 2021-02-22 (×2): 10 mg via ORAL
  Filled 2021-02-20 (×2): qty 1

## 2021-02-20 MED ORDER — SODIUM ZIRCONIUM CYCLOSILICATE 5 G PO PACK
5.0000 g | PACK | Freq: Once | ORAL | Status: DC
  Filled 2021-02-20: qty 1

## 2021-02-20 MED ORDER — ATORVASTATIN CALCIUM 10 MG PO TABS
20.0000 mg | ORAL_TABLET | Freq: Every day | ORAL | Status: DC
  Administered 2021-02-21 – 2021-02-22 (×2): 20 mg via ORAL
  Filled 2021-02-20 (×2): qty 2

## 2021-02-20 MED ORDER — TRAZODONE HCL 50 MG PO TABS
50.0000 mg | ORAL_TABLET | Freq: Every day | ORAL | Status: DC
  Administered 2021-02-20 – 2021-02-21 (×2): 50 mg via ORAL
  Filled 2021-02-20 (×2): qty 1

## 2021-02-20 NOTE — Hospital Course (Addendum)
Hospital follow up: - ASA for CAD   5/20:  Patient states she's doing well this morning. No acute complaints. Mentions she hasn't felt dizzy today but has not had to try and stand up. Does mention pressure-like headache from hitting her head. Denies vision changes, nubmness, or tingling. Mentions more chronic cough but otherwise no dyspnea or chest pain. No previous history of thyroid disease.

## 2021-02-20 NOTE — ED Triage Notes (Signed)
BIB in [facility] after family called to report pt had fallen with + LOC. Pt found on floor but alert. Per E[facility], when standing patient to get on stretcher, pt passed out again for about 20 seconds. Pt A & O x 4 at bedside. Pt has some mouth trauma r/t fall.  Hx: DM, HTN

## 2021-02-20 NOTE — ED Notes (Signed)
Attempted to give reportx1 

## 2021-02-20 NOTE — ED Provider Notes (Signed)
[facility] EMERGENCY DEPARTMENT Provider Note   CSN: [id number] Arrival date & time: 02/20/21  1044     History Chief Complaint  Patient presents with  . Loss of Consciousness    Tara Patel is a 65 y.o. female.  HPI   Patient presents to the ED for evaluation of a syncopal episode.  Patient states this morning she had fall with episode of loss of consciousness.  Patient did wake up this morning feeling lightheaded.  Patient was found on the floor but she was alert.  When EMS to try to get her up to stand she had another syncopal episode lasting about 20 seconds.  Patient denies any trouble with chest pain.  No vomiting or diarrhea.  No abdominal pain.  She did injure her mouth in the fall.  She denies any dental pain.  No jaw malocclusion.  She denies any neck pain numbness or weakness.  Past Medical History:  Diagnosis Date  . Asthma   . Chest pain    Mild nonobstructive CAD (by cath Aug '10)  . CKD (chronic kidney disease) stage 3, GFR 30-59 ml/min   . Coronary artery disease    Mild, nonobstructive by catheterization 05/23/2009  . Depression   . Diabetes mellitus   . GERD (gastroesophageal reflux disease)   . Hypercholesteremia   . Hypertension   . Normocytic anemia 10/19/2013  . Obesity   . OSA (obstructive sleep apnea)    CPAP Q HS    Patient Active Problem List   Diagnosis Date Noted  . Chest pain with moderate risk for cardiac etiology 10/19/2013  . Hypokalemia 10/19/2013  . Cough 10/19/2013  . Unspecified chronic bronchitis (HCC) 10/19/2013  . Depression 10/19/2013  . GERD (gastroesophageal reflux disease) 10/19/2013  . Normocytic anemia 10/19/2013  . Diabetes mellitus, type 2 (HCC) 10/08/2011  . Hypertension 10/08/2011  . Hyperlipidemia 10/08/2011  . Obesity 10/08/2011  . Obstructive sleep apnea 10/08/2011  . CKD (chronic kidney disease) stage 3, GFR 30-59 ml/min (HCC) 10/08/2011    Past Surgical History:  Procedure Laterality  Date  . ABDOMINAL HYSTERECTOMY    . ADENOIDECTOMY    . NASAL SEPTUM SURGERY    . REPLACEMENT TOTAL KNEE    . TONSILLECTOMY    . TUBAL LIGATION       OB History   No obstetric history on file.     Family History  Problem Relation Age of Onset  . CAD Mother        s/p triple bypass  . Diabetes Mother   . Hypertension Mother   . Cancer Other   . Diabetes Other   . Cancer Sister        Colon  . Cancer Brother        Colon  . Diabetes Brother     Social History   Tobacco Use  . Smoking status: Never Smoker  . Smokeless tobacco: Never Used  Substance Use Topics  . Alcohol use: Not Currently    Comment: Rare   . Drug use: No    Home Medications Prior to Admission medications   Medication Sig Start Date End Date Taking? Authorizing Provider  ACCU-CHEK GUIDE test strip 1 each by Other route 3 (three) times daily. for testing 01/25/21  Yes [provider]  amLODipine (NORVASC) 10 MG tablet Take 10 mg by mouth daily. 01/27/21  Yes [provider]  atorvastatin (LIPITOR) 20 MG tablet Take 20 mg by mouth daily. 01/03/21  Yes  [provider]  buPROPion (WELLBUTRIN XL) 150 MG 24 hr tablet Take 150 mg by mouth daily.   Yes [provider]  busPIRone (BUSPAR) 15 MG tablet Take 15 mg by mouth daily.   Yes [provider]  Calcium Carbonate-Vitamin D 600-400 MG-UNIT tablet Take 2 tablets by mouth daily.   Yes [provider]  HUMALOG KWIKPEN 100 UNIT/ML KwikPen Inject 15 Units into the skin as directed. If sugar is above 200 02/07/21  Yes [provider]  insulin aspart (NOVOLOG) 100 UNIT/ML injection Inject 60 Units into the skin 3 (three) times daily with meals. (ONLY WHEN SHE EATS A MEAL) long acting.   Yes [provider]  insulin glargine (LANTUS) 100 UNIT/ML injection Inject 36-38 Units into the skin 2 (two) times daily.   Yes [provider]  omeprazole (PRILOSEC) 40 MG capsule Take 40 mg by mouth 2  (two) times daily. 12/16/20  Yes [provider]  Peppermint Oil (IBGARD) 90 MG CPCR Take 90 mg by mouth daily.   Yes [provider]  potassium chloride (KLOR-CON) 10 MEQ tablet Take 10 mEq by mouth daily. 01/24/21  Yes [provider]  prazosin (MINIPRESS) 2 MG capsule Take 2 mg by mouth at bedtime.   Yes [provider]  traZODone (DESYREL) 50 MG tablet Take 50 mg by mouth at bedtime.   Yes [provider]  venlafaxine (EFFEXOR) 25 MG tablet Take 75 mg by mouth 2 (two) times daily.   Yes [provider]  simvastatin (ZOCOR) 40 MG tablet Take 20 mg by mouth at bedtime.    07/23/19  [provider]    Allergies    Baclofen  Review of Systems   Review of Systems  All other systems reviewed and are negative.   Physical Exam Updated Vital Signs BP 119/67   Pulse 99   Temp (!) 97.5 F (36.4 C) (Oral)   Resp 13   SpO2 99%   Physical Exam Vitals and nursing note reviewed.  Constitutional:      General: She is not in acute distress.    Appearance: She is well-developed.  HENT:     Head: Normocephalic and atraumatic.     Right Ear: External ear normal.     Left Ear: External ear normal.     Mouth/Throat:     Comments: 2 small lacerations noted to the lower lip mucosal surface, wound edges are well approximated, no active bleeding, no loose dentition, no jaw malocclusion Eyes:     General: No scleral icterus.       Right eye: No discharge.        Left eye: No discharge.     Conjunctiva/sclera: Conjunctivae normal.  Neck:     Trachea: No tracheal deviation.  Cardiovascular:     Rate and Rhythm: Normal rate and regular rhythm.  Pulmonary:     Effort: Pulmonary effort is normal. No respiratory distress.     Breath sounds: Normal breath sounds. No stridor. No wheezing or rales.  Abdominal:     General: Bowel sounds are normal. There is no distension.     Palpations: Abdomen is soft.     Tenderness: There is no  abdominal tenderness. There is no guarding or rebound.  Musculoskeletal:        General: No tenderness.     Right shoulder: No swelling, tenderness or bony tenderness.     Left shoulder: No swelling, tenderness or bony tenderness.     Right  wrist: No swelling, tenderness or bony tenderness.     Left wrist: No swelling, tenderness or bony tenderness.     Cervical back: Neck supple. No swelling, tenderness or bony tenderness.     Thoracic back: No swelling, tenderness or bony tenderness.     Lumbar back: No swelling, tenderness or bony tenderness.     Right hip: No tenderness or bony tenderness. Normal range of motion.     Left hip: No tenderness or bony tenderness. Normal range of motion.     Right ankle: No swelling. No tenderness.     Left ankle: No swelling. No tenderness.  Skin:    General: Skin is warm and dry.     Findings: No rash.  Neurological:     Mental Status: She is alert.     Cranial Nerves: No cranial nerve deficit (no facial droop, extraocular movements intact, no slurred speech).     Sensory: No sensory deficit.     Motor: No abnormal muscle tone or seizure activity.     Coordination: Coordination normal.     ED Results / Procedures / Treatments   Labs (all labs ordered are listed, but only abnormal results are displayed) Labs Reviewed  APTT - Abnormal; Notable for the following components:      Result Value   aPTT 21 (*)    All other components within normal limits  COMPREHENSIVE METABOLIC PANEL - Abnormal; Notable for the following components:   Potassium 5.2 (*)    Glucose, Bld 408 (*)    Creatinine, Ser 1.30 (*)    Total Protein 6.1 (*)    Albumin 3.2 (*)    AST 42 (*)    GFR, Estimated 45 (*)    All other components within normal limits  RESP PANEL BY RT-PCR (FLU A&B, COVID) ARPGX2  ETHANOL  PROTIME-INR  CBC  DIFFERENTIAL  RAPID URINE DRUG SCREEN, HOSP PERFORMED  URINALYSIS, ROUTINE W REFLEX MICROSCOPIC  D-DIMER, QUANTITATIVE  TROPONIN I (HIGH  SENSITIVITY)  TROPONIN I (HIGH SENSITIVITY)    EKG EKG Interpretation  Date/Time:  Thursday Feb 20 2021 10:52:30 EDT Ventricular Rate:  98 PR Interval:  184 QRS Duration: 82 QT Interval:  375 QTC Calculation: 479 R Axis:   44 Text Interpretation: Sinus rhythm No significant change since last tracing Confirmed by Linwood DibblesKnapp, Harmonee Tozer [id number]) on 02/20/2021 2:31:22 PM   Radiology CT HEAD WO CONTRAST  Result Date: 02/20/2021 CLINICAL DATA:  Focal neuro deficit, greater than 6 hours, stroke suspected. Neck trauma. Fall. EXAM: CT HEAD WITHOUT CONTRAST CT CERVICAL SPINE WITHOUT CONTRAST TECHNIQUE: Multidetector CT imaging of the head and cervical spine was performed following the standard protocol without intravenous contrast. Multiplanar CT image reconstructions of the cervical spine were also generated. COMPARISON:  Prior head CT examination 05/05/2004. FINDINGS: CT HEAD FINDINGS Brain: Cerebral volume is normal. There is no acute intracranial hemorrhage. No demarcated cortical infarct. No extra-axial fluid collection. No evidence of intracranial mass. No midline shift. Vascular: No hyperdense vessel.  Atherosclerotic calcifications. Skull: Normal. Negative for fracture or focal lesion. Sinuses/Orbits: Visualized orbits show no acute finding. Trace bilateral ethmoid and maxillary sinus mucosal thickening at the imaged levels. CT CERVICAL SPINE FINDINGS Alignment: Cervicothoracic dextrocurvature. Straightening of the expected cervical lordosis. Trace C3-C4 and C4-C5 grade 1 anterolisthesis. Skull base and vertebrae: The basion-dental and atlanto-dental intervals are maintained.No evidence of acute fracture to the cervical spine. Soft tissues and spinal canal: No prevertebral fluid or swelling. No visible canal hematoma. Disc levels: Cervical spondylosis. No  more than mild disc space narrowing. Multilevel disc bulges/central disc protrusions. Mild multilevel uncovertebral hypertrophy. Multilevel facet arthrosis.  No appreciable high-grade spinal canal stenosis. Uncovertebral hypertrophy results in bony neural foraminal narrowing on the right at C3-C4. Upper chest: No consolidation within the imaged lung apices. No visible pneumothorax. Other: 1.7 cm right thyroid lobe nodule. IMPRESSION: CT head: 1. No evidence of acute intracranial abnormality. 2. Carotid and vertebral artery atherosclerotic calcification. 3. Mild paranasal sinus disease, as described. CT cervical spine: 1. No evidence of acute fracture to the cervical spine. 2. Nonspecific straightening of the expected cervical lordosis. 3. Cervicothoracic dextrocurvature. 4. Trace C3-C4 and C4-C5 grade 1 anterolisthesis. 5. 1.7 cm right thyroid lobe nodule. Nonemergent thyroid ultrasound recommended for further evaluation. Electronically Signed   By: Jackey Loge DO   On: 02/20/2021 12:46   CT Cervical Spine Wo Contrast  Result Date: 02/20/2021 CLINICAL DATA:  Focal neuro deficit, greater than 6 hours, stroke suspected. Neck trauma. Fall. EXAM: CT HEAD WITHOUT CONTRAST CT CERVICAL SPINE WITHOUT CONTRAST TECHNIQUE: Multidetector CT imaging of the head and cervical spine was performed following the standard protocol without intravenous contrast. Multiplanar CT image reconstructions of the cervical spine were also generated. COMPARISON:  Prior head CT examination 05/05/2004. FINDINGS: CT HEAD FINDINGS Brain: Cerebral volume is normal. There is no acute intracranial hemorrhage. No demarcated cortical infarct. No extra-axial fluid collection. No evidence of intracranial mass. No midline shift. Vascular: No hyperdense vessel.  Atherosclerotic calcifications. Skull: Normal. Negative for fracture or focal lesion. Sinuses/Orbits: Visualized orbits show no acute finding. Trace bilateral ethmoid and maxillary sinus mucosal thickening at the imaged levels. CT CERVICAL SPINE FINDINGS Alignment: Cervicothoracic dextrocurvature. Straightening of the expected cervical lordosis. Trace  C3-C4 and C4-C5 grade 1 anterolisthesis. Skull base and vertebrae: The basion-dental and atlanto-dental intervals are maintained.No evidence of acute fracture to the cervical spine. Soft tissues and spinal canal: No prevertebral fluid or swelling. No visible canal hematoma. Disc levels: Cervical spondylosis. No more than mild disc space narrowing. Multilevel disc bulges/central disc protrusions. Mild multilevel uncovertebral hypertrophy. Multilevel facet arthrosis. No appreciable high-grade spinal canal stenosis. Uncovertebral hypertrophy results in bony neural foraminal narrowing on the right at C3-C4. Upper chest: No consolidation within the imaged lung apices. No visible pneumothorax. Other: 1.7 cm right thyroid lobe nodule. IMPRESSION: CT head: 1. No evidence of acute intracranial abnormality. 2. Carotid and vertebral artery atherosclerotic calcification. 3. Mild paranasal sinus disease, as described. CT cervical spine: 1. No evidence of acute fracture to the cervical spine. 2. Nonspecific straightening of the expected cervical lordosis. 3. Cervicothoracic dextrocurvature. 4. Trace C3-C4 and C4-C5 grade 1 anterolisthesis. 5. 1.7 cm right thyroid lobe nodule. Nonemergent thyroid ultrasound recommended for further evaluation. Electronically Signed   By: Jackey Loge DO   On: 02/20/2021 12:46   DG Chest Portable 1 View  Result Date: 02/20/2021 CLINICAL DATA:  Syncope, fall EXAM: PORTABLE CHEST 1 VIEW COMPARISON:  07/23/2019 FINDINGS: The heart size and mediastinal contours are within normal limits. Both lungs are clear. The visualized skeletal structures are unremarkable. IMPRESSION: No acute abnormality of the lungs in AP portable projection. Electronically Signed   By: Lauralyn Primes M.D.   On: 02/20/2021 12:20    Procedures .Critical Care Performed by: Linwood Dibbles, MD Authorized by: Linwood Dibbles, MD   Critical care provider statement:    Critical care time (minutes):  45   Critical care was time spent  personally by me on the following activities:  Discussions with consultants, evaluation of  patient's response to treatment, examination of patient, ordering and performing treatments and interventions, ordering and review of laboratory studies, ordering and review of radiographic studies, pulse oximetry, re-evaluation of patient's condition, obtaining history from patient or surrogate and review of old charts     Medications Ordered in ED Medications  aspirin chewable tablet 324 mg (has no administration in time range)  insulin aspart (novoLOG) injection 8 Units (has no administration in time range)  nitroGLYCERIN (NITROSTAT) SL tablet 0.4 mg (has no administration in time range)    ED Course  I have reviewed the triage vital signs and the nursing notes.  Pertinent labs & imaging results that were available during my care of the patient were reviewed by me and considered in my medical decision making (see chart for details).  Clinical Course as of 02/20/21 1435  Thu Feb 20, 2021  1407 Initial troponin is normal.  COVID and flu are negative. [JK]  1407 Metabolic panel notable for hyperglycemia.  CBC is normal [JK]    Clinical Course User Index [JK] Linwood Dibbles, MD   MDM Rules/Calculators/A&P                          Patient presented to the ED for evaluation after a syncopal episode.  Did not appear to brace her fall and did sustain a lip laceration.  Fortunately she does not have any signs of serious injury.  While she has been here she has developed some chest discomfort as well.  Initial EKG and troponin are normal.  No signs of ST elevation MI.  We will add on a D-dimer and check a delta troponin.  Patient is at risk for cardiac dysrhythmia.  She is not anemic or dehydrated.  I we will consult the medical service for admission and further evaluation of her syncope and chest pain. Final Clinical Impression(s) / ED Diagnoses Final diagnoses:  Syncope, unspecified syncope type  Thyroid  nodule  Lip laceration, initial encounter  Hyperglycemia  Chest pain, unspecified type      Linwood Dibbles, MD 02/20/21 1436

## 2021-02-20 NOTE — ED Notes (Signed)
Patient transported to CT 

## 2021-02-20 NOTE — H&P (Signed)
Date: 02/20/2021               Patient Name:  Tara Patel MRN: [id number]  DOB: June 20, 1956 Age / Sex: 65 y.o., female   PCP: System, Provider Not In         Medical Service: Internal Medicine Teaching Service         Attending Physician: Dr. Ninetta Lights, Lacretia Leigh, MD    First Contact: Dr. Laddie Aquas Pager: [phone]  Second Contact: Dr. Mcarthur Rossetti Pager: [phone]       After Hours (After 5p/  First Contact Pager: [phone]  weekends / holidays): Second Contact Pager: [phone]   Chief Complaint: Syncope   History of Present Illness:   [name] is a 65 y.o. lady w/ PMHx Type II DM, CKD stage 3, mild non-obstructive CAD on cath in 2010, HTN, HLD, MDD, normocytic anemia, ?Asthma/OSA on CPAP and GERD, who was brought to the ED by EMS after experiencing multiple syncopal episodes. She states that she has been struggling with "dizziness" over the past week. She denies true vertiginous symptoms but has felt light-headedness that occurs more frequently after standing quickly but also seems to occur at random and is not particularly associated with exertion or head movements. She says these episodes have lasted for just seconds at a time. She says she was trying to leave her room this morning when she fell and possibly lost consciousness. She says her daughter helped her get up and as she was walking to the bathroom, she fell again. She says "I guess I blacked out" and didn't remember falling. She did strike her lip and didn't brace her fall. She says she currently feels her usual self and denies any symptoms associated with her dizziness including CP, SOB, diaphoresis, palpitations, tremors, nausea, vomiting, changes in bowel function, or any other symptoms. She says she's had pleuritic CP in the past (attibuted to pleurisy per patient) although this has resolved for some time. She says she has also been followed by her PCP for bilateral lower abdominal pain and decreased appetite over the past 2-3  months associated with an 8lb weight loss with nausea and vomiting after eating. She notes she has been spending most of her days laying in bed and attributes this to fear of hurting herself due to her dizziness recently. She has developed generalized weakness due to this time in bed and wonders whether that plus a decrease in PO intake may have contributed to her LOC. She says she no longer participates in activities she previously enjoyed, feels lonely spending most of the day alone, is "practically nocturnal", and more fatigued recently. She says her doctor has recently adjusted the timing of her Wellbutrin and Buspar to the morning and she denies any other recent medication changes. Denies any current depressed mood or other mood changes. Daughter notes "this" (presumed dizziness, weakness, depression) has been going on much longer than patient endorses. She notes her CBG's have been elevated to the 300's recently at home with polyuria and polydipsia and attributes this to a recent switch from ozempic once weekly back to basal bolus regimen due to hypoglycemic episodes on Ozempic. She follows with a PCP through the [place] (?Dr. Illene Labrador) and GI.  Home Medications: Current Meds  Medication Sig  . ACCU-CHEK GUIDE test strip 1 each by Other route 3 (three) times daily. for testing  . amLODipine (NORVASC) 10 MG tablet Take 10 mg by mouth daily.  Marland Kitchen atorvastatin (LIPITOR) 20 MG tablet Take  20 mg by mouth daily.  Marland Kitchen buPROPion (WELLBUTRIN XL) 150 MG 24 hr tablet Take 150 mg by mouth daily.  . busPIRone (BUSPAR) 15 MG tablet Take 15 mg by mouth daily.  . Calcium Carbonate-Vitamin D 600-400 MG-UNIT tablet Take 2 tablets by mouth daily.  Marland Kitchen HUMALOG KWIKPEN 100 UNIT/ML KwikPen Inject 15 Units into the skin as directed. If sugar is above 200  . insulin aspart (NOVOLOG) 100 UNIT/ML injection Inject 60 Units into the skin 3 (three) times daily with meals. (ONLY WHEN SHE EATS A MEAL) long acting.  . insulin glargine  (LANTUS) 100 UNIT/ML injection Inject 36-38 Units into the skin 2 (two) times daily.  Marland Kitchen omeprazole (PRILOSEC) 40 MG capsule Take 40 mg by mouth 2 (two) times daily.  Marland Kitchen Peppermint Oil (IBGARD) 90 MG CPCR Take 90 mg by mouth daily.  . potassium chloride (KLOR-CON) 10 MEQ tablet Take 10 mEq by mouth daily.  . prazosin (MINIPRESS) 2 MG capsule Take 2 mg by mouth at bedtime.  . traZODone (DESYREL) 50 MG tablet Take 50 mg by mouth at bedtime.  Marland Kitchen venlafaxine (EFFEXOR) 25 MG tablet Take 75 mg by mouth 2 (two) times daily.    Allergies: Allergies as of 02/20/2021 - Review Complete 02/20/2021  Allergen Reaction Noted  . Baclofen Nausea And Vomiting 07/19/2020   Past Medical History:  Diagnosis Date  . Asthma   . Chest pain    Mild nonobstructive CAD (by cath Aug '10)  . CKD (chronic kidney disease) stage 3, GFR 30-59 ml/min   . Coronary artery disease    Mild, nonobstructive by catheterization 05/23/2009  . Depression   . Diabetes mellitus   . GERD (gastroesophageal reflux disease)   . Hypercholesteremia   . Hypertension   . Normocytic anemia 10/19/2013  . Obesity   . OSA (obstructive sleep apnea)    CPAP Q HS    Family History: Brother and sister - colon cancer, diagnosed in 48's Family History  Problem Relation Age of Onset  . CAD Mother        s/p triple bypass  . Diabetes Mother   . Hypertension Mother   . Cancer Other   . Diabetes Other   . Cancer Sister        Colon  . Cancer Brother        Colon  . Diabetes Brother    Social History:  Lives at home with her brothers, independent in ADL's. Patient denies any history of tobacco use.  Says she previously rarely drank alcohol but has increased intake to one 16oz beer daily. Prior history of cocaine use, none since 2013.  Review of Systems: A complete ROS was negative except as per HPI.   Physical Exam: Blood pressure 119/68, pulse 100, temperature (!) 97.5 F (36.4 C), temperature source Oral, resp. rate 18, SpO2 99  %.  General: Patient is resting comfortable and appears well. No acute distress. Eyes: Sclera non-icteric. No conjunctival injection.  HENT: Slightly dry mucus membranes. No nasal discharge. Respiratory: Lungs are CTA, bilaterally. No wheezes, rales, or rhonchi. Saturating in the 90's on room air.  Cardiovascular: Rate is tachycardic to the 110's. Rhythm is regular. There is a 2/6 systolic murmur over the right upper chest. No other obvious murmurs, rubs, or gallops. No lower extremity edema. Distal pulses are 2+ in all four extremities.  Neurological: Alert and oriented x 4. CN II-XII intact. No tremors.  MSK: Normal muscle bulk and tone.  Abdominal: Soft and non-tender to  palpation. Bowel sounds intact. No rebound or guarding. Skin: No lesions. No rashes.  Psych: Affect is slightly flat. Normal tone of voice.   EKG: personally reviewed my interpretation is NSR at a rate of 106 bpm with T wave inversions in lead III but no consecutive T wave inversions or ST segment changes to suggest acute ischemia and no other findings to suggest right heart strain. Normal PR and QTc intervals.   CXR: personally reviewed my interpretation is unremarkable CXR.  Assessment & Plan by Problem: Active Problems:   Syncope and collapse  # Witnessed Syncope  # Sinus Tachycardia  Patient experienced at least one episode of syncope PTA and denies history of syncope or seizure in the past. She did not have any prodromal symptoms. She is currently tachycardic but otherwise HDS.  Syncope was most likely in the setting of low volume status due to recent hyperglycemia, decreased PO intake (w/ hx nausea / vomiting), and physical deconditioning possibly exacerbated by depression. Physical exam does not suggest presence of DVT; however, D-dimer was elevated to 2.45 and given her tachycardia and recent immobility, she is at increased risk of PE. Labs this morning were notable for a mildly elevated potassium of 5.2, glucose of  408, baseline renal function and normal troponin's, CBC, INR, ETOH level and flu and COVID-19 PCR. CT head did show carotid and vertebral artery atherosclerosis although with unspecified stenosis and CT cervical spine noted an incidental 1.7cm right thyroid lobe nodule with trace C3-C4 and C4-C5 grade 1 anterolisthesis although no acute abnormalities to explain syncope without any focal neurological deficits on exam.   - CTA chest pending  - Check ECHO - Urine tox pending  - U/A pending  - Continuous telemetry and pulse oximetry  - Morning CMP, CBC, Magnesium, TSH and free T4   - Check morning thyroid ultrasound - PT/OT eval and treat  # Type II DM, Uncontrolled  Last Hgb A1c in Epic was 8.6 from 2010. States her Ozempic was recently switched back to basal bolus insulin due to hypoglycemia; however, her sugars have been uncontrolled recently > 400 on admission although without signs of DKA.  - Check Hgb A1c  - Check lantus 40 units nightly  - Moderate SSI w/ meals  - Holding HS correction   # MDD Patient and daughter say she has spent a majority of her time in bed recently with decreased PO intake, weight loss, anhedonia, sleep-wake cycle disturbances, and fatigue. Takes Wellbutrin and Buspar at home, managed by her PCP. Denies depressed mood currently.  - Continue home Trazodone, Venlafaxine, Bupropion and Buspar   # GERD # Chronic Nausea / Vomiting  Patient endorses chronic nausea and vomiting that typically occurs just after eating. Symptoms sound most consistent with gastroparesis, likely due to uncontrolled DM although she does not have a gastric emptying study in Epic. Her CTA chest showed an incidental hypodensity on the lower-most image of the liver, incompletely visualized, that could represent a hypodense lesion.  - Check abdominal ultrasound to r/o hepatic lesion  - Prilosec 40mg  daily  - Zofran PRN   # HTN Patient's blood pressure has been well controlled here. She takes  amlodipine 10mg  at home.  - Hold home antihypertensives for now  - Continue monitoring   # HLD  No recent lipid panel although previously well controlled.  - Atorvastatin 20mg  daily w/ outpatient follow up  # CKD stage 3 # Hyperkalemia, Mild  Renal function is at her baseline. K slightly elevated to  5.2 although she takes potassium supplements 10mEq daily outpatient.  - Monitor I&O and daily weights - Lokelma 5mg  once - Check morning CMP   # Unspecified Chronic Bronchitis  # Possible OSA  Per Epic, patient uses CPAP at night. No PFT's are documented in the system.  - CPAP nightly   # Possible AUD Patient notes a recent increase in alcohol consumption but denies any symptoms of withdrawal. Considering this may be related to mild tachycardia as well.  - Encourage limiting alcohol use to <1 standard drink daily   Code Status: Full Code  IVF: None  Diet: HH/CM DVT PPx: Lovenox   Dispo: Admit patient to Observation with expected length of stay less than 2 midnights.   Signed: Glenford BayleySpeakman, Daquawn Seelman, MD 02/20/2021, 7:50 PM  Pager: [phone] After 5pm on weekdays and 1pm on weekends: On Call pager: [phone]

## 2021-02-21 ENCOUNTER — Ambulatory Visit (INDEPENDENT_AMBULATORY_CARE_PROVIDER_SITE_OTHER): Payer: Medicare Other

## 2021-02-21 ENCOUNTER — Encounter (HOSPITAL_COMMUNITY): Payer: Self-pay | Admitting: Infectious Diseases

## 2021-02-21 ENCOUNTER — Other Ambulatory Visit: Payer: Self-pay

## 2021-02-21 ENCOUNTER — Observation Stay (HOSPITAL_COMMUNITY): Payer: No Typology Code available for payment source

## 2021-02-21 DIAGNOSIS — W1830XA Fall on same level, unspecified, initial encounter: Secondary | ICD-10-CM | POA: Diagnosis present

## 2021-02-21 DIAGNOSIS — E042 Nontoxic multinodular goiter: Secondary | ICD-10-CM | POA: Diagnosis present

## 2021-02-21 DIAGNOSIS — Y9301 Activity, walking, marching and hiking: Secondary | ICD-10-CM | POA: Diagnosis present

## 2021-02-21 DIAGNOSIS — I2510 Atherosclerotic heart disease of native coronary artery without angina pectoris: Secondary | ICD-10-CM | POA: Diagnosis present

## 2021-02-21 DIAGNOSIS — E7800 Pure hypercholesterolemia, unspecified: Secondary | ICD-10-CM | POA: Diagnosis present

## 2021-02-21 DIAGNOSIS — R55 Syncope and collapse: Secondary | ICD-10-CM

## 2021-02-21 DIAGNOSIS — E1122 Type 2 diabetes mellitus with diabetic chronic kidney disease: Secondary | ICD-10-CM | POA: Diagnosis present

## 2021-02-21 DIAGNOSIS — I951 Orthostatic hypotension: Secondary | ICD-10-CM | POA: Diagnosis present

## 2021-02-21 DIAGNOSIS — E785 Hyperlipidemia, unspecified: Secondary | ICD-10-CM | POA: Diagnosis present

## 2021-02-21 DIAGNOSIS — S01511A Laceration without foreign body of lip, initial encounter: Secondary | ICD-10-CM | POA: Diagnosis present

## 2021-02-21 DIAGNOSIS — J45909 Unspecified asthma, uncomplicated: Secondary | ICD-10-CM | POA: Diagnosis present

## 2021-02-21 DIAGNOSIS — Z888 Allergy status to other drugs, medicaments and biological substances status: Secondary | ICD-10-CM | POA: Diagnosis not present

## 2021-02-21 DIAGNOSIS — I672 Cerebral atherosclerosis: Secondary | ICD-10-CM | POA: Diagnosis present

## 2021-02-21 DIAGNOSIS — E041 Nontoxic single thyroid nodule: Secondary | ICD-10-CM | POA: Diagnosis not present

## 2021-02-21 DIAGNOSIS — Z20822 Contact with and (suspected) exposure to covid-19: Secondary | ICD-10-CM | POA: Diagnosis present

## 2021-02-21 DIAGNOSIS — E860 Dehydration: Secondary | ICD-10-CM | POA: Diagnosis present

## 2021-02-21 DIAGNOSIS — K219 Gastro-esophageal reflux disease without esophagitis: Secondary | ICD-10-CM | POA: Diagnosis present

## 2021-02-21 DIAGNOSIS — I129 Hypertensive chronic kidney disease with stage 1 through stage 4 chronic kidney disease, or unspecified chronic kidney disease: Secondary | ICD-10-CM | POA: Diagnosis present

## 2021-02-21 DIAGNOSIS — N1831 Chronic kidney disease, stage 3a: Secondary | ICD-10-CM | POA: Diagnosis present

## 2021-02-21 DIAGNOSIS — Y92009 Unspecified place in unspecified non-institutional (private) residence as the place of occurrence of the external cause: Secondary | ICD-10-CM | POA: Diagnosis not present

## 2021-02-21 DIAGNOSIS — E1165 Type 2 diabetes mellitus with hyperglycemia: Secondary | ICD-10-CM | POA: Diagnosis present

## 2021-02-21 DIAGNOSIS — E875 Hyperkalemia: Secondary | ICD-10-CM | POA: Diagnosis present

## 2021-02-21 DIAGNOSIS — J42 Unspecified chronic bronchitis: Secondary | ICD-10-CM | POA: Diagnosis present

## 2021-02-21 DIAGNOSIS — Z794 Long term (current) use of insulin: Secondary | ICD-10-CM | POA: Diagnosis not present

## 2021-02-21 DIAGNOSIS — Z79899 Other long term (current) drug therapy: Secondary | ICD-10-CM | POA: Diagnosis not present

## 2021-02-21 DIAGNOSIS — R000 Tachycardia, unspecified: Secondary | ICD-10-CM | POA: Diagnosis present

## 2021-02-21 DIAGNOSIS — F329 Major depressive disorder, single episode, unspecified: Secondary | ICD-10-CM | POA: Diagnosis present

## 2021-02-21 DIAGNOSIS — G4733 Obstructive sleep apnea (adult) (pediatric): Secondary | ICD-10-CM | POA: Diagnosis present

## 2021-02-21 DIAGNOSIS — E119 Type 2 diabetes mellitus without complications: Secondary | ICD-10-CM | POA: Diagnosis not present

## 2021-02-21 LAB — COMPREHENSIVE METABOLIC PANEL
ALT: 39 U/L (ref 0–44)
AST: 25 U/L (ref 15–41)
Albumin: 3.1 g/dL — ABNORMAL LOW (ref 3.5–5.0)
Alkaline Phosphatase: 111 U/L (ref 38–126)
Anion gap: 7 (ref 5–15)
BUN: 13 mg/dL (ref 8–23)
CO2: 27 mmol/L (ref 22–32)
Calcium: 9.4 mg/dL (ref 8.9–10.3)
Chloride: 103 mmol/L (ref 98–111)
Creatinine, Ser: 1.37 mg/dL — ABNORMAL HIGH (ref 0.44–1.00)
GFR, Estimated: 43 mL/min — ABNORMAL LOW (ref >60)
Glucose, Bld: 273 mg/dL — ABNORMAL HIGH (ref 70–99)
Potassium: 4 mmol/L (ref 3.5–5.1)
Sodium: 137 mmol/L (ref 135–145)
Total Bilirubin: 0.6 mg/dL (ref 0.3–1.2)
Total Protein: 6.4 g/dL — ABNORMAL LOW (ref 6.5–8.1)

## 2021-02-21 LAB — CBC
HCT: 35.4 % — ABNORMAL LOW (ref 36.0–46.0)
Hemoglobin: 11.3 g/dL — ABNORMAL LOW (ref 12.0–15.0)
MCH: 26.7 pg (ref 26.0–34.0)
MCHC: 31.9 g/dL (ref 30.0–36.0)
MCV: 83.5 fL (ref 80.0–100.0)
Platelets: 277 10*3/uL (ref 150–400)
RBC: 4.24 MIL/uL (ref 3.87–5.11)
RDW: 12.6 % (ref 11.5–15.5)
WBC: 8.2 10*3/uL (ref 4.0–10.5)
nRBC: 0 % (ref 0.0–0.2)

## 2021-02-21 LAB — ECHOCARDIOGRAM COMPLETE
AR max vel: 2.28 cm2
AV Area VTI: 2.11 cm2
AV Area mean vel: 2.3 cm2
AV Mean grad: 3 mmHg
AV Peak grad: 5.6 mmHg
Ao pk vel: 1.18 m/s
Area-P 1/2: 9.6 cm2
Calc EF: 38.5 %
MV VTI: 2.68 cm2
S' Lateral: 3 cm
Single Plane A2C EF: 42.8 %
Single Plane A4C EF: 32 %

## 2021-02-21 LAB — URINALYSIS, ROUTINE W REFLEX MICROSCOPIC
Bilirubin Urine: NEGATIVE
Glucose, UA: NEGATIVE mg/dL
Hgb urine dipstick: NEGATIVE
Ketones, ur: NEGATIVE mg/dL
Leukocytes,Ua: NEGATIVE
Nitrite: NEGATIVE
Protein, ur: NEGATIVE mg/dL
Specific Gravity, Urine: 1.01 (ref 1.005–1.030)
pH: 5 (ref 5.0–8.0)

## 2021-02-21 LAB — RAPID URINE DRUG SCREEN, HOSP PERFORMED
Amphetamines: NOT DETECTED
Barbiturates: NOT DETECTED
Benzodiazepines: NOT DETECTED
Cocaine: NOT DETECTED
Opiates: NOT DETECTED
Tetrahydrocannabinol: NOT DETECTED

## 2021-02-21 LAB — BASIC METABOLIC PANEL
Anion gap: 6 (ref 5–15)
BUN: 14 mg/dL (ref 8–23)
CO2: 26 mmol/L (ref 22–32)
Calcium: 8.9 mg/dL (ref 8.9–10.3)
Chloride: 103 mmol/L (ref 98–111)
Creatinine, Ser: 1.28 mg/dL — ABNORMAL HIGH (ref 0.44–1.00)
GFR, Estimated: 46 mL/min — ABNORMAL LOW (ref >60)
Glucose, Bld: 377 mg/dL — ABNORMAL HIGH (ref 70–99)
Potassium: 4 mmol/L (ref 3.5–5.1)
Sodium: 135 mmol/L (ref 135–145)

## 2021-02-21 LAB — PROTIME-INR
INR: 1.1 (ref 0.8–1.2)
Prothrombin Time: 14 seconds (ref 11.4–15.2)

## 2021-02-21 LAB — TSH: TSH: 1.03 u[IU]/mL (ref 0.350–4.500)

## 2021-02-21 LAB — HIV ANTIBODY (ROUTINE TESTING W REFLEX): HIV Screen 4th Generation wRfx: NONREACTIVE

## 2021-02-21 LAB — T4, FREE: Free T4: 0.82 ng/dL (ref 0.61–1.12)

## 2021-02-21 LAB — HEMOGLOBIN A1C
Hgb A1c MFr Bld: 9.5 % — ABNORMAL HIGH (ref 4.8–5.6)
Mean Plasma Glucose: 225.95 mg/dL

## 2021-02-21 LAB — GLUCOSE, CAPILLARY
Glucose-Capillary: 221 mg/dL — ABNORMAL HIGH (ref 70–99)
Glucose-Capillary: 329 mg/dL — ABNORMAL HIGH (ref 70–99)
Glucose-Capillary: 141 mg/dL — ABNORMAL HIGH (ref 70–99)
Glucose-Capillary: 204 mg/dL — ABNORMAL HIGH (ref 70–99)

## 2021-02-21 LAB — APTT: aPTT: 25 seconds (ref 24–36)

## 2021-02-21 LAB — MAGNESIUM: Magnesium: 1.4 mg/dL — ABNORMAL LOW (ref 1.7–2.4)

## 2021-02-21 MED ORDER — MAGNESIUM SULFATE 4 GM/100ML IV SOLN
4.0000 g | Freq: Once | INTRAVENOUS | Status: AC
  Administered 2021-02-21: 4 g via INTRAVENOUS
  Filled 2021-02-21: qty 100

## 2021-02-21 MED ORDER — INSULIN ASPART 100 UNIT/ML IJ SOLN
10.0000 [IU] | Freq: Once | INTRAMUSCULAR | Status: AC
  Administered 2021-02-21: 10 [IU] via SUBCUTANEOUS

## 2021-02-21 MED ORDER — LACTATED RINGERS IV BOLUS
1000.0000 mL | Freq: Once | INTRAVENOUS | Status: AC
  Administered 2021-02-21: 1000 mL via INTRAVENOUS

## 2021-02-21 MED ORDER — LACTATED RINGERS IV BOLUS
500.0000 mL | Freq: Once | INTRAVENOUS | Status: AC
  Administered 2021-02-21: 500 mL via INTRAVENOUS

## 2021-02-21 MED ORDER — INSULIN GLARGINE 100 UNIT/ML ~~LOC~~ SOLN
50.0000 [IU] | Freq: Every day | SUBCUTANEOUS | Status: DC
  Administered 2021-02-21: 50 [IU] via SUBCUTANEOUS
  Filled 2021-02-21 (×2): qty 0.5

## 2021-02-21 MED ORDER — INSULIN ASPART 100 UNIT/ML IJ SOLN
0.0000 [IU] | Freq: Three times a day (TID) | INTRAMUSCULAR | Status: DC
Start: 2021-02-21 — End: 2021-02-22
  Administered 2021-02-21 – 2021-02-22 (×2): 3 [IU] via SUBCUTANEOUS
  Administered 2021-02-22: 7 [IU] via SUBCUTANEOUS

## 2021-02-21 MED ORDER — ENOXAPARIN SODIUM 60 MG/0.6ML IJ SOSY: 50.0000 mg | PREFILLED_SYRINGE | INTRAMUSCULAR | Status: DC

## 2021-02-21 MED ORDER — ENOXAPARIN SODIUM 40 MG/0.4ML IJ SOSY
40.0000 mg | PREFILLED_SYRINGE | INTRAMUSCULAR | Status: DC
  Administered 2021-02-21: 40 mg via SUBCUTANEOUS
  Filled 2021-02-21: qty 0.4

## 2021-02-21 MED ORDER — INSULIN ASPART 100 UNIT/ML IJ SOLN
0.0000 [IU] | Freq: Every day | INTRAMUSCULAR | Status: DC
  Administered 2021-02-21: 2 [IU] via SUBCUTANEOUS

## 2021-02-21 NOTE — Evaluation (Signed)
Occupational Therapy Evaluation Patient Details Name: Tara Patel MRN: [id number] DOB: 20-Dec-1955 Today's Date: 02/21/2021    History of Present Illness 65 yo F with syncopal episodes at home. She had definite LOC with one episode. Her CT head showed no acute changes and her TTE was normal.  PMHx Type II DM, CKD stage 3, mild non-obstructive CAD on cath in 2010, HTN, HLD, MDD, normocytic anemia, ?Asthma/OSA on CPAP and GERD.   Clinical Impression   Pt admitted to ED ffor concerns listed above. PTA pt reported she was independent with all ADL's and IADL's. Pt reports living with her 2 brothers who are available to assist her as needed. Pt demonstrated increased weakness and decreased activity tolerance this session. Pt was very shaky and only able to take a 5 steps forwards and backwards with RW. PTA pt reported that she did not use any AD for walking, however at this time she is heavily reliant on the walker for balance. Pt is able to complete all ADL's, with increased time and supervision for safety. Acute OT will continue to follow up to address concerns addressed below.     Follow Up Recommendations  No OT follow up    Equipment Recommendations  None recommended by OT    Recommendations for Other Services       Precautions / Restrictions Precautions Precautions: Fall Restrictions Weight Bearing Restrictions: No      Mobility Bed Mobility Overal bed mobility: Modified Independent             General bed mobility comments: Pt had no difficulty getting into or out of bed    Transfers Overall transfer level: Needs assistance Equipment used: Rolling walker (2 wheeled) Transfers: Sit to/from Stand Sit to Stand: Min guard         General transfer comment: Min guard for saftey due to weakness    Balance Overall balance assessment: Needs assistance Sitting-balance support: No upper extremity supported;Feet supported Sitting balance-Leahy Scale: Good      Standing balance support: Bilateral upper extremity supported Standing balance-Leahy Scale: Poor Standing balance comment: Pt is heavily reliant on bilateral UE support. Static standing pt can maintain balance unsupported, however any challenge to her balance causes her to sit/fall back.                           ADL either performed or assessed with clinical judgement   ADL Overall ADL's : Needs assistance/impaired Eating/Feeding: Independent;Sitting   Grooming: Wash/dry hands;Wash/dry face;Sitting;Set up Grooming Details (indicate cue type and reason): Pt demonstrating low activity tolerance at this time, unable to tolerate standing at sink Upper Body Bathing: Min guard;Sitting Upper Body Bathing Details (indicate cue type and reason): ROM WFL to complete UB bathing Lower Body Bathing: Minimal assistance;Sitting/lateral leans Lower Body Bathing Details (indicate cue type and reason): Pt neeeds min assist due to balance concerns/dizziness at this time Upper Body Dressing : Set up;Sitting   Lower Body Dressing: Min guard;Sitting/lateral leans Lower Body Dressing Details (indicate cue type and reason): Min guard due to dizziness and balance concerns Toilet Transfer: Min guard;Ambulation;RW Toilet Transfer Details (indicate cue type and reason): Min guard for safety Toileting- Clothing Manipulation and Hygiene: Supervision/safety;Sitting/lateral lean Toileting - Clothing Manipulation Details (indicate cue type and reason): sup for safety Tub/ Shower Transfer: Min guard;Ambulation;Tub bench Tub/Shower Transfer Details (indicate cue type and reason): Min guard for safety due to dizziness and balance concerns Functional mobility during ADLs: Min guard;Rolling  walker General ADL Comments: Pt was very weak and shaky this session, unable to ambulate without RW, Pt's activity tolerance is low, limiting her ability to complete standing tasks.     Vision Baseline Vision/History:  Wears glasses Wears Glasses: Reading only Patient Visual Report: No change from baseline Vision Assessment?: No apparent visual deficits     Perception Perception Perception Tested?: No   Praxis Praxis Praxis tested?: Not tested    Pertinent Vitals/Pain Pain Assessment: No/denies pain     Hand Dominance Right   Extremity/Trunk Assessment Upper Extremity Assessment Upper Extremity Assessment: Overall WFL for tasks assessed   Lower Extremity Assessment Lower Extremity Assessment: Defer to PT evaluation   Cervical / Trunk Assessment Cervical / Trunk Assessment: Normal   Communication Communication Communication: No difficulties   Cognition Arousal/Alertness: Awake/alert Behavior During Therapy: WFL for tasks assessed/performed Overall Cognitive Status: Within Functional Limits for tasks assessed                                     General Comments  VSS on RA    Exercises     Shoulder Instructions      Home Living Family/patient expects to be discharged to:: Private residence Living Arrangements: Other relatives Available Help at Discharge: Family;Available 24 hours/day Type of Home: House Home Access: Ramped entrance     Home Layout: One level     Bathroom Shower/Tub: Chief Strategy Officer: Handicapped height Bathroom Accessibility: Yes How Accessible: Accessible via walker Home Equipment: Walker - 2 wheels;Cane - single point;Tub bench          Prior Functioning/Environment Level of Independence: Independent with assistive device(s)                 OT Problem List: Decreased strength;Decreased activity tolerance;Impaired balance (sitting and/or standing);Decreased coordination;Decreased safety awareness;Decreased knowledge of use of DME or AE      OT Treatment/Interventions:      OT Goals(Current goals can be found in the care plan section) Acute Rehab OT Goals Patient Stated Goal: To feel safe walking again OT  Goal Formulation: With patient Time For Goal Achievement: 03/07/21 Potential to Achieve Goals: Good ADL Goals Pt Will Transfer to Toilet: with modified independence;ambulating Additional ADL Goal #1: Pt will complete bathing sitting and standing from tub bench with mod I, using AE as needed. Additional ADL Goal #2: Pt will tolerate standing at the sink to complete 3 grooming tasks, to assist in increasing her activity tolerance. Additional ADL Goal #3: Pt will verbalize 3 fall prevention strategies prior to DC.  OT Frequency:     Barriers to D/C:            Co-evaluation              AM-PAC OT "6 Clicks" Daily Activity     Outcome Measure Help from another person eating meals?: None Help from another person taking care of personal grooming?: A Little Help from another person toileting, which includes using toliet, bedpan, or urinal?: A Little Help from another person bathing (including washing, rinsing, drying)?: A Little Help from another person to put on and taking off regular upper body clothing?: A Little Help from another person to put on and taking off regular lower body clothing?: A Little 6 Click Score: 19   End of Session Equipment Utilized During Treatment: Gait belt;Rolling walker Nurse Communication: Mobility status;Other (comment) (Pt demonstrating  increased weakness)  Activity Tolerance: Patient tolerated treatment well;Patient limited by fatigue Patient left: in bed;with call bell/phone within reach  OT Visit Diagnosis: Unsteadiness on feet (R26.81);Muscle weakness (generalized) (M62.81);History of falling (Z91.81)                Time: 6967-8938 OT Time Calculation (min): 19 min Charges:  OT General Charges $OT Visit: 1 Visit OT Evaluation $OT Eval Moderate Complexity: 1 Mod  Karene Bracken H., OTR/L Acute Rehabilitation  Carisa Backhaus Elane Bing Plume 02/21/2021, 2:13 PM

## 2021-02-21 NOTE — Care Management (Signed)
[facility] Charise Carwin fax [phone], [facility] Dory Horn [phone] ext [phone] or Wilder Glade [phone] ext [phone].  Faxed clinicals and left messages for SW.   Ronny Flurry RN

## 2021-02-21 NOTE — Progress Notes (Signed)
*  PRELIMINARY RESULTS* Echocardiogram 2D Echocardiogram has been performed.  Neomia Dear RDCS 02/21/2021, 8:25 AM

## 2021-02-21 NOTE — Progress Notes (Signed)
Zio patch placed onto patient.  All instructions and information reviewed with patient, they verbalize understanding with no questions. 

## 2021-02-21 NOTE — Progress Notes (Signed)
Subjective:   Tara Patel states that she is feeling much better compared to yesterday. She says she has not had any recurrent episodes of dizziness or light-headedness since being in the hospital and feels she is getting some of her strength back. She was able to eat and drink well and denies feeling dehydrated currently. She denies any CP, palpitations, SOB, fevers, chills, abdominal pain, blood in her stools, or any other symptoms. Denies any history of thyroid disease although is uncertain whether she is UTD on her cancer screenings and notes she is about to get a colonoscopy in the near future.   Objective:  Vital signs in last 24 hours: Vitals:   02/21/21 1438 02/21/21 1506 02/21/21 1509 02/21/21 1512  BP: 127/70     Pulse: (!) 103     Resp: 17     Temp: 98.1 F (36.7 C)     TempSrc: Oral     SpO2: 100% 98% 100% 100%   General: Patient is resting comfortably and appears well. No acute distress. Eyes: Sclera non-icteric. No conjunctival injection.  HENT: Moist mucus membranes. She has a ~1cm, non-bleeding linear laceration over her bottom lip. Respiratory: Lungs are CTA, bilaterally. No wheezes, rales, or rhonchi. Saturating well on room air.  Cardiovascular: Rate is slightly tachycardic. Rhythm is regular. No m/r/g. No lower extremity edema. Distal pulses are 2+ in all four extremities.  Neurological: Alert and oriented x 4. No tremors. Abdominal: Soft and non-tender to palpation. Bowel sounds intact. No rebound or guarding. Skin: No lesions. No rashes.  Psych: Normal affect. Normal tone of voice.   Assessment/Plan:  Active Problems:   Syncope and collapse  # Witnessed Syncope  # Sinus Tachycardia  # Right Lobe Thyroid Nodule  # Hypomagnesemia Patient has not had any recurrence of her symptoms in the hospital and her generalized weakness is improving with good PO intake. However, she does remain tachycardic up to the 110's. Her magnesium today was noted to be low at 1.4  which may be contributing to heart rate abnormalities especially with prolonged QTc on telemetry although she has not had any clear arrhythmias since last night. CTA chest showed no evidence of PE. It did show an incidental thyroid nodule, moderately suspicious for malignancy (Ti-Rads 4) with recommendation for FNA although her thyroid function testing was unremarkable so less likely contributing to her symptoms. ECHO did show Grade I Diastolic Dysfunction but no systolic dysfunction or other significant structural changes. Her hyperkalemia resolved with Lokelma, although her sugars have remained elevated in the 200-300's today. She denies feeling dehydrated, although she was found earlier today to be orthostatic. Her orthostasis resolved with 1L bolus (likely due to hyperglycemia). Her urine had not been collected since yesterday for U/A or urine tox. She does not have other symptoms to suggest withdrawal and telemetry review did now show any clear arrhythmias.  - Will give Mag Sulfate 4g IVPB  - Continuous telemetry  - Cardiology were consulted who placed Zio Patch x 7 days  - Patient has been scheduled for f/u appointment 6/14 although will need to stay one more night  - Get better control of glucose (as below) - Encourage good PO intake  - Will need outpatient follow up for thyroid FNA bx  - Continue PT/OT   # Type II DM, Uncontrolled  Last Hgb A1c in Epic was 8.6 from 2010 which increased to 9.5% this admission. States her Ozempic was recently switched back to basal bolus insulin due to hypoglycemia;  however, her sugars have been uncontrolled recently > 400 on admission although without signs of DKA and remain elevated 200-300's today s/p Lasix 40 units last night with moderate SSI.  - Increased SSI to resistant w/ HS coverage - Increased evening Lantus to 50 units  - CBG's TID WC & HS  # MDD Patient is on multiple QTc-prolonging psychotropic medications which may be contributing to  tachycardia.  - Will repeat EKG - Would benefit from close outpatient follow up  # GERD # Chronic Nausea / Vomiting  Patient states she has been eating well in the hospital without current symptoms. Abdominal ultrasound checked to r/o liver lesion did now show any acute findings.  - Prilosec 40mg  daily  - Zofran PRN   # HTN Patient's blood pressure has been overall controlled here. She takes amlodipine 10mg  at home.  - Hold home antihypertensives for now  - Continue monitoring   # HLD  No recent lipid panel although previously well controlled.  - Atorvastatin 20mg  daily w/ outpatient follow up  # CKD stage 3 # Hyperkalemia, Mild  Renal function has slightly improved with fluids and potassium has normalized.  - Monitor I&O and daily weights  # Unspecified Chronic Bronchitis  # Possible OSA  Per Epic, patient may be on CPAP at home.  - Will need clarified tomorrow - Consider outpatient pulmonary follow up although she is asymptomatic here   Code Status: Full Code  IVF: None  Diet: HH/CM DVT PPx: Lovenox   Prior to Admission Living Arrangement: Home Anticipated Discharge Location: Home Barriers to Discharge: Glucose control  Dispo: Anticipated discharge tomorrow   , MD 02/21/2021, 4:53 PM Pager: [phone] After 5pm on weekdays and 1pm on weekends: On Call pager [phone]

## 2021-02-21 NOTE — Evaluation (Signed)
Physical Therapy Evaluation Patient Details Name: Tara Patel MRN: [id number] DOB: 19-Dec-1955 Today's Date: 02/21/2021   History of Present Illness  65 yo F with syncopal episodes at home. She had definite LOC with one episode. Her CT head showed no acute changes and her TTE was normal.  Pt dx with sinus tach, R lobe thyroid nodule, hypomagnesemia, cardiology placed and zio heart monitor patch to monitor her for the next 7 days.  PMHx Type II DM (uncontrolled), CKD stage 3, mild non-obstructive CAD on cath in 2010, HTN, HLD, MDD, normocytic anemia, ?Asthma, chronic bronchitis.  Clinical Impression  Pt did well ambulating around the room multiple times with me, twice with RW, once with hand held assist.  Pt was much more stable with RW as her R leg especially was getting a bit bouncy with multiple small buckles during gait without AD. Vestibular screen preformed and was pretty benign (because she reported "dizziness" sounds more like blacking out as she cannot remember the events during the episode, just that she is on the floor).  PT to follow acutely for deficits listed below.    Follow Up Recommendations Home health PT    Equipment Recommendations  Rolling walker with 5" wheels    Recommendations for Other Services       Precautions / Restrictions Precautions Precautions: Fall      Mobility  Bed Mobility Overal bed mobility: Modified Independent             General bed mobility comments: Pt had no difficulty getting into or out of bed    Transfers Overall transfer level: Needs assistance Equipment used: Rolling walker (2 wheeled) Transfers: Sit to/from Stand Sit to Stand: Min guard;Supervision         General transfer comment: Min guard for saftey due to weakness, was able to work up to close supervision with multiple practices. Educated on standing a bit longer than normal to assess how she feels before walking away from bed or sitting  surface.  Ambulation/Gait Ambulation/Gait assistance: Min guard;Min assist Gait Distance (Feet): 20 Feet (x3) Assistive device: Rolling walker (2 wheeled);1 person hand held assist Gait Pattern/deviations: Step-through pattern (dropping/buckling moments)     General Gait Details: Pt needed min guard assist with RW, however, I wanted to see how she did without as she normally does not use a RW or AD at home for gait. Min hand held assist without RW and several moments of dropping at her right leg (buckling, but the catching and came back up).  Stairs            Wheelchair Mobility    Modified Rankin (Stroke Patients Only)       Balance Overall balance assessment: Needs assistance Sitting-balance support: No upper extremity supported;Feet supported Sitting balance-Leahy Scale: Good     Standing balance support: Bilateral upper extremity supported Standing balance-Leahy Scale: Poor Standing balance comment: Pt is less reliant on UE than with OT, however, she does look more stable and safer with both hands supported.                             Pertinent Vitals/Pain      Home Living Family/patient expects to be discharged to:: Private residence Living Arrangements: Other relatives Available Help at Discharge: Family;Available 24 hours/day Type of Home: House Home Access: Ramped entrance     Home Layout: One level Home Equipment: Walker - 2 wheels;Cane - single point;Tub bench  Prior Function Level of Independence: Independent with assistive device(s)               Hand Dominance   Dominant Hand: Right    Extremity/Trunk Assessment   Upper Extremity Assessment Upper Extremity Assessment: Defer to OT evaluation    Lower Extremity Assessment Lower Extremity Assessment: Generalized weakness    Cervical / Trunk Assessment Cervical / Trunk Assessment: Normal  Communication   Communication: No difficulties  Cognition  Arousal/Alertness: Awake/alert Behavior During Therapy: WFL for tasks assessed/performed Overall Cognitive Status: Within Functional Limits for tasks assessed                                        General Comments General comments (skin integrity, edema, etc.): HR 107 during gait.  Pt reported HA from hitting her head/face during one of her falls at home.  PT educated re: decreasing stimulation when HA comes on to try to make sure to not make it worse.  Educated on fall precautions, especially at night turning on lights to get up to go to the restroom.  Encouraged RW use    Exercises     Assessment/Plan    PT Assessment Patient needs continued PT services  PT Problem List Decreased strength;Decreased activity tolerance;Decreased balance;Decreased mobility;Decreased knowledge of use of DME       PT Treatment Interventions DME instruction;Gait training;Functional mobility training;Therapeutic activities;Therapeutic exercise;Balance training;Cognitive remediation;Patient/family education    PT Goals (Current goals can be found in the Care Plan section)  Acute Rehab PT Goals Patient Stated Goal: To feel safe walking again PT Goal Formulation: With patient Time For Goal Achievement: 03/08/21 Potential to Achieve Goals: Good    Frequency Min 3X/week   Barriers to discharge        Co-evaluation               AM-PAC PT "6 Clicks" Mobility  Outcome Measure Help needed turning from your back to your side while in a flat bed without using bedrails?: None Help needed moving from lying on your back to sitting on the side of a flat bed without using bedrails?: None Help needed moving to and from a bed to a chair (including a wheelchair)?: A Little Help needed standing up from a chair using your arms (e.g., wheelchair or bedside chair)?: A Little Help needed to walk in hospital room?: A Little Help needed climbing 3-5 steps with a railing? : A Little 6 Click Score:  20    End of Session Equipment Utilized During Treatment: Gait belt Activity Tolerance: Patient tolerated treatment well Patient left: in chair;with call bell/phone within reach   PT Visit Diagnosis: Muscle weakness (generalized) (M62.81);Difficulty in walking, not elsewhere classified (R26.2);History of falling (Z91.81)    Time: 0254-2706 PT Time Calculation (min) (ACUTE ONLY): 30 min   Charges:   PT Evaluation $PT Eval Moderate Complexity: 1 Mod PT Treatments $Gait Training: 8-22 mins       Corinna Capra, PT, DPT  Acute Rehabilitation [phone] pager [phone]) [phone] office

## 2021-02-21 NOTE — Plan of Care (Signed)
Received patient for care from the ED. Patient alert and oriented x4. No acute distress noted. Patient settled into room, admission assessment completed. Will continue to monitor patient as per orders/care plan.

## 2021-02-22 ENCOUNTER — Encounter (HOSPITAL_COMMUNITY): Payer: Self-pay | Admitting: Infectious Diseases

## 2021-02-22 DIAGNOSIS — R55 Syncope and collapse: Secondary | ICD-10-CM

## 2021-02-22 DIAGNOSIS — F329 Major depressive disorder, single episode, unspecified: Secondary | ICD-10-CM

## 2021-02-22 DIAGNOSIS — E041 Nontoxic single thyroid nodule: Secondary | ICD-10-CM

## 2021-02-22 DIAGNOSIS — E119 Type 2 diabetes mellitus without complications: Secondary | ICD-10-CM

## 2021-02-22 LAB — BASIC METABOLIC PANEL
Anion gap: 9 (ref 5–15)
BUN: 12 mg/dL (ref 8–23)
CO2: 23 mmol/L (ref 22–32)
Calcium: 9.2 mg/dL (ref 8.9–10.3)
Chloride: 107 mmol/L (ref 98–111)
Creatinine, Ser: 1.13 mg/dL — ABNORMAL HIGH (ref 0.44–1.00)
GFR, Estimated: 54 mL/min — ABNORMAL LOW (ref >60)
Glucose, Bld: 51 mg/dL — ABNORMAL LOW (ref 70–99)
Potassium: 4.5 mmol/L (ref 3.5–5.1)
Sodium: 139 mmol/L (ref 135–145)

## 2021-02-22 LAB — CBC
HCT: 35.5 % — ABNORMAL LOW (ref 36.0–46.0)
Hemoglobin: 11.4 g/dL — ABNORMAL LOW (ref 12.0–15.0)
MCH: 26.6 pg (ref 26.0–34.0)
MCHC: 32.1 g/dL (ref 30.0–36.0)
MCV: 82.9 fL (ref 80.0–100.0)
Platelets: 257 10*3/uL (ref 150–400)
RBC: 4.28 MIL/uL (ref 3.87–5.11)
RDW: 12.5 % (ref 11.5–15.5)
WBC: 6.6 10*3/uL (ref 4.0–10.5)
nRBC: 0 % (ref 0.0–0.2)

## 2021-02-22 LAB — GLUCOSE, CAPILLARY
Glucose-Capillary: 70 mg/dL (ref 70–99)
Glucose-Capillary: 132 mg/dL — ABNORMAL HIGH (ref 70–99)
Glucose-Capillary: 233 mg/dL — ABNORMAL HIGH (ref 70–99)
Glucose-Capillary: 70 mg/dL (ref 70–99)

## 2021-02-22 MED ORDER — INSULIN GLARGINE 100 UNIT/ML ~~LOC~~ SOLN: 20.0000 [IU] | Freq: Two times a day (BID) | SUBCUTANEOUS | 11 refills | Status: DC

## 2021-02-22 MED ORDER — POLYETHYLENE GLYCOL 3350 17 G PO PACK
17.0000 g | PACK | Freq: Once | ORAL | Status: AC
  Administered 2021-02-22: 17 g via ORAL
  Filled 2021-02-22: qty 1

## 2021-02-22 MED ORDER — SENNOSIDES-DOCUSATE SODIUM 8.6-50 MG PO TABS
2.0000 | ORAL_TABLET | Freq: Once | ORAL | Status: AC
  Administered 2021-02-22: 2 via ORAL
  Filled 2021-02-22: qty 2

## 2021-02-22 NOTE — Plan of Care (Signed)
  Problem: Education: Goal: Knowledge of General Education information will improve Description: Including pain rating scale, medication(s)/side effects and non-pharmacologic comfort measures Outcome: Completed/Met   Problem: Health Behavior/Discharge Planning: Goal: Ability to manage health-related needs will improve Outcome: Completed/Met   Problem: Clinical Measurements: Goal: Ability to maintain clinical measurements within normal limits will improve Outcome: Completed/Met Goal: Will remain free from infection Outcome: Completed/Met Goal: Diagnostic test results will improve Outcome: Completed/Met Goal: Respiratory complications will improve Outcome: Completed/Met Goal: Cardiovascular complication will be avoided Outcome: Completed/Met   Problem: Activity: Goal: Risk for activity intolerance will decrease Outcome: Completed/Met   Problem: Nutrition: Goal: Adequate nutrition will be maintained Outcome: Completed/Met   Problem: Coping: Goal: Level of anxiety will decrease Outcome: Completed/Met   Problem: Elimination: Goal: Will not experience complications related to bowel motility Outcome: Completed/Met Goal: Will not experience complications related to urinary retention Outcome: Completed/Met   Problem: Elimination: Goal: Will not experience complications related to urinary retention Outcome: Completed/Met   Problem: Pain Managment: Goal: General experience of comfort will improve Outcome: Completed/Met   Problem: Safety: Goal: Ability to remain free from injury will improve Outcome: Completed/Met   Problem: Skin Integrity: Goal: Risk for impaired skin integrity will decrease Outcome: Completed/Met   Discharge instructions reviewed with patient.  These included, but were not limited to, the following:  discharge medication, medication changes, activity recommendations, MD follow-up appointments, when to call the MD, rationale for treatment, utilization of  emergency response, etc.  Comprehension of discharge instructions ascertained via use of "teach-back" technique.

## 2021-02-22 NOTE — Discharge Summary (Signed)
Name: Tara Patel MRN: [id number] DOB: Oct 01, 1956 65 y.o. PCP: System, Provider Not In  Date of Admission: 02/20/2021 10:44 AM Date of Discharge: 02/22/21 Attending Physician: Dr Charissa Bash   Discharge Diagnosis: 1. Syncope 2. Diabetes 3. Thyroid nodule  4. Major depression disorder   Discharge Medications: Allergies as of 02/22/2021      Reactions   Baclofen Nausea And Vomiting      Medication List    STOP taking these medications   insulin aspart 100 UNIT/ML injection Commonly known as: novoLOG   potassium chloride 10 MEQ tablet Commonly known as: KLOR-CON     TAKE these medications   Accu-Chek Guide test strip Generic drug: glucose blood 1 each by Other route 3 (three) times daily. for testing   amLODipine 10 MG tablet Commonly known as: NORVASC Take 10 mg by mouth daily.   atorvastatin 20 MG tablet Commonly known as: LIPITOR Take 20 mg by mouth daily.   buPROPion 150 MG 24 hr tablet Commonly known as: WELLBUTRIN XL Take 150 mg by mouth daily.   busPIRone 15 MG tablet Commonly known as: BUSPAR Take 15 mg by mouth daily.   Calcium Carbonate-Vitamin D 600-400 MG-UNIT tablet Take 2 tablets by mouth daily.   HumaLOG KwikPen 100 UNIT/ML KwikPen Generic drug: insulin lispro Inject 15 Units into the skin as directed. If sugar is above 200   IBgard 90 MG Cpcr Generic drug: Peppermint Oil Take 90 mg by mouth daily.   insulin glargine 100 UNIT/ML injection Commonly known as: LANTUS Inject 0.2 mLs (20 Units total) into the skin 2 (two) times daily. What changed: how much to take   omeprazole 40 MG capsule Commonly known as: PRILOSEC Take 40 mg by mouth 2 (two) times daily.   prazosin 2 MG capsule Commonly known as: MINIPRESS Take 2 mg by mouth at bedtime.   traZODone 50 MG tablet Commonly known as: DESYREL Take 50 mg by mouth at bedtime.   venlafaxine 25 MG tablet Commonly known as: EFFEXOR Take 75 mg by mouth 2 (two) times daily.             Durable Medical Equipment  (From admission, onward)         Start     Ordered   02/22/21 1332  DME Walker  Once       Question Answer Comment  Walker: With 5 Inch Wheels   Patient needs a walker to treat with the following condition Syncope and collapse      02/22/21 1336          Disposition and follow-up:   Ms.Tara Patel was discharged from [facility] in Stable condition.  At the hospital follow up visit please address:  1.  Syncope: likely 2/2 orthostatic hypotension in setting of dehydration; symptoms improved with fluid resuscitation; discharged with cardiac monitor; will need cardiology follow-up  Diabetes: HbA1c 9.5. Patient will need insulin adjustments for goal Hb <7.   Thyroid nodule: Incidental finding. TSH and free T4 wnl; recommend FNA biopsy  Major depression disorder: Continue home venlafaxine, wellbutrin, and buspirone. May benefit from psychiatry referral.   2.  Labs / imaging needed at time of follow-up: BMP, FNA biopsy  3.  Pending labs/ test needing follow-up: F/u ziopatch results w/cardiologist  Follow-up Appointments:  Follow-up Information    [facility] Follow up in 2 week(s).   Contact information: [address] [place] [address] [phone]  Hospital Course by problem list: 1. Syncope 2/2 orthostatic hypotension Patient presented with syncopal episode or loss of consciousness witnessed by family after one week of lightheadedness/fatigue.  She did endorse decreased oral intake.  Patient noted to be tachycardic on exam.  She is noted to be baseline renal function with normal troponins, CBC, alcohol level and flu COVID-negative.  She was found to have D-dimer elevated 2.45 and CTA chest obtained which is negative for PE.  CT head is negative for any acute intracranial abnormalities, although did show carotid and vertebral artery atherosclerosis  without known stenosis.  Echo with left ventricular ejection fraction 55 to 60% with grade 1 diastolic dysfunction no valvular abnormalities noted.  Patient kept on cardiac monitoring with no significant events noted.  Suspect the patient's syncope was secondary to dehydration and decreased oral intake and increased polyuria secondary to poor glycemic control.  Patient given IV hydration with improvement in symptoms.  She reported improved oral intake on discharge.  Patient encouraged to PCP for improved glycemic control and age-appropriate cancer screening.  2. Diabetes mellitus Patient with an A1c 9.5 on admission.  She notes that she was recently on Ozempic weekly injection and was transitioned to basal bolus regimen.  She notes blood sugars in the 200-300 range while on Ozempic weekly and notes that these have been slightly improved when she transition to basal bolus regimen recently.  Patient initially started on Lantus 40 units daily with sliding scale insulin.  However, this was increased to 50 units daily.  Patient did not have a hypoglycemic event with that regimen.  Patient notes that she uses Lantus 20 units twice daily with Humalog 15 units 3 times daily for CBGs >200.  We will continue patient on home regimen and have her follow-up closely with PCP for insulin adjustments.  3. Thyroid nodule  Patient noted to have incidental finding of thyroid nodule that is 1.6 cm in largest diameter.  TSH and free T4 normal.  Recommended to have FNA biopsy outpatient.  4. Major depression disorder  Patient with significant depression symptoms including anhedonia, fatigue, decreased oral intake, abnormal sleep cycle.  She is on venlafaxine, Wellbutrin, BuSpar, trazodone.  Patient may need referral to psychiatry for resistant depression management if no significant improvement with current regimen.  Discharge Exam:    Ms Tara Patel was evaluated on day of exam. She is sitting up at bedside chair. She  reports improved symptoms with resolution of her lightheadedness/dizziness on standing. She was able to tolerate oral intake. Patient was encouraged to follow up with her PCP for improved diabetes control, age-appropriate cancer screenings and possible psychiatry referral for ongoing depression despite being on anti-depression agents. She is also recommended to follow up with cardiology for her cardiac monitoring. Patient is agreeable to this. No further questions/concerns.   BP 132/68 (BP Location: Left Arm)   Pulse 88   Temp 98.1 F (36.7 C) (Oral)   Resp 18   Wt 79 kg   SpO2 100%   BMI 28.98 kg/m    Discharge exam:  Physical Exam  Constitutional: Appears well-developed and well-nourished. No distress.  HENT: Normocephalic and atraumatic, EOMI, conjunctiva normal, moist mucous membranes Cardiovascular: Normal rate, regular rhythm, S1 and S2 present, no murmurs, rubs, gallops.  Distal pulses intact Respiratory: No respiratory distress, no accessory muscle use.  Effort is normal.  Lungs are clear to auscultation bilaterally. GI: Nondistended, soft, nontender to palpation, active bowel sounds Musculoskeletal: Normal bulk and tone.  No peripheral edema  noted. Neurological: Is alert and oriented x4, no apparent focal deficits noted. Skin: Warm and dry.  No rash, erythema, noted. ~1cm nonbleeding linear laceration on bottom lip Psychiatric: Normal mood and affect. Behavior is normal. Judgment and thought content normal.   Pertinent Labs, Studies, and Procedures:  CBC Latest Ref Rng & Units 02/22/2021 02/21/2021 02/20/2021  WBC 4.0 - 10.5 K/uL 6.6 8.2 7.4  Hemoglobin 12.0 - 15.0 g/dL 11.4(L) 11.3(L) 12.1  Hematocrit 36.0 - 46.0 % 35.5(L) 35.4(L) 38.2  Platelets 150 - 400 K/uL 257 277 230   CMP Latest Ref Rng & Units 02/22/2021 02/21/2021 02/21/2021  Glucose 70 - 99 mg/dL 99(J) 570(V) 779(T)  BUN 8 - 23 mg/dL 12 14 13   Creatinine 0.44 - 1.00 mg/dL ) 9.03(E) 0.92(Z)  Sodium 135 - 145  mmol/L 139 135 137  Potassium 3.5 - 5.1 mmol/L 4.5 4.0 4.0  Chloride 98 - 111 mmol/L 107 103 103  CO2 22 - 32 mmol/L 23 26 27   Calcium 8.9 - 10.3 mg/dL 9.2 8.9 9.4  Total Protein 6.5 - 8.1 g/dL - - 6.4(L)  Total Bilirubin 0.3 - 1.2 mg/dL - - 0.6  Alkaline Phos 38 - 126 U/L - - 111  AST 15 - 41 U/L - - 25  ALT 0 - 44 U/L - - 39   Urinalysis    Component Value Date/Time   COLORURINE STRAW (A) 02/21/2021 1918   APPEARANCEUR CLEAR 02/21/2021 1918   LABSPEC 1.010 02/21/2021 1918   PHURINE 5.0 02/21/2021 1918   GLUCOSEU NEGATIVE 02/21/2021 1918   HGBUR NEGATIVE 02/21/2021 1918   BILIRUBINUR NEGATIVE 02/21/2021 1918   KETONESUR NEGATIVE 02/21/2021 1918   PROTEINUR NEGATIVE 02/21/2021 1918   UROBILINOGEN 0.2 01/30/2014 0845   NITRITE NEGATIVE 02/21/2021 1918   LEUKOCYTESUR NEGATIVE 02/21/2021 1918   Drugs of Abuse     Component Value Date/Time   LABOPIA NONE DETECTED 02/21/2021 1918   COCAINSCRNUR NONE DETECTED 02/21/2021 1918   LABBENZ NONE DETECTED 02/21/2021 1918   AMPHETMU NONE DETECTED 02/21/2021 1918   THCU NONE DETECTED 02/21/2021 1918   LABBARB NONE DETECTED 02/21/2021 1918    Hgb A1c MFr Bld 4.8 - 5.6 % 9.5High    TSH 0.350 - 4.500 uIU/mL 1.030    Free T4 0.61 - 1.12 ng/dL 02/23/2021    Troponin I (High Sensitivity) <18 ng/L 3  <2 CM    D-Dimer, Quant 0.00 - 0.50 ug/mL-FEU 2.45High     CXR 02/20/2021: IMPRESSION: No acute abnormality of the lungs in AP portable projection.  CT HEAD CERVICAL SPINE WITHOUT CONTRAST 02/20/2021: IMPRESSION: CT head: 1. No evidence of acute intracranial abnormality. 2. Carotid and vertebral artery atherosclerotic calcification. 3. Mild paranasal sinus disease, as described. CT cervical spine: 1. No evidence of acute fracture to the cervical spine. 2. Nonspecific straightening of the expected cervical lordosis. 3. Cervicothoracic dextrocurvature. 4. Trace C3-C4 and C4-C5 grade 1 anterolisthesis. 5. 1.7 cm right thyroid lobe  nodule. Nonemergent thyroid ultrasound recommended for further evaluation.  CT ANGIO CHEST 02/20/2021: IMPRESSION: 1. No pulmonary embolus or other acute thoracic abnormality. 2. Hypodensity on the lower most image in the anterior liver, possibly the incompletely visualized gallbladder fundus. Consider hepatic ultrasound for confirmation and exclusion of a hypodense lesion within the liver parenchyma.  ECHO 02/21/2021:  IMPRESSIONS    1. Left ventricular ejection fraction, by estimation, is 55 to 60%. The  left ventricle has normal function. The left ventricle has no regional  wall motion abnormalities. There is mild concentric left  ventricular  hypertrophy. Left ventricular diastolic  parameters are consistent with Grade I diastolic dysfunction (impaired  relaxation).  2. Right ventricular systolic function is normal. The right ventricular  size is normal. There is normal pulmonary artery systolic pressure.  3. The mitral valve is normal in structure. Trivial mitral valve  regurgitation.  4. The aortic valve is tricuspid. Aortic valve regurgitation is not  visualized. No aortic stenosis is present.    RUQ [place] 02/20/2021: IMPRESSION: Cholelithiasis. No hepatic mass visualized. The hypodense lesion noted on prior CT examination represents a partially visualized gallbladder.  THYROID US 02/20/2021:  IMPRESSION: Solitary tirads 4 thyroid nodule within the mid right thyroid lobe. Given its size and imaging characteristics, tissue sampling is recommended.   Discharge Instructions: Discharge Instructions    Call MD for:  difficulty breathing, headache or visual disturbances   Complete by: As directed    Call MD for:  extreme fatigue   Complete by: As directed    Call MD for:  hives   Complete by: As directed    Call MD for:  persistant dizziness or light-headedness   Complete by: As directed    Call MD for:  persistant nausea and vomiting   Complete by: As directed     Call MD for:  temperature >100.4   Complete by: As directed    Diet Carb Modified   Complete by: As directed    Discharge instructions   Complete by: As directed    Ms Aveah Castell,  You were admitted to the hospital after an episode of passing out. We suspect that this was likely due to dehydration from decreased appetite. Please make sure you are eating and drinking enough on discharge.  You also received a cardiac monitor to monitor for any abnormal heart rhythms. Please follow up with the cardiology office as instructed.  On discharge, it will be very important to keep your blood sugars under control. Please follow up with your primary care doctor within 1 week of leaving the hospital. It is important to follow closely for insulin adjustments for goal HbA1c <7.  Once your blood sugars are under better control, your frequent urination will likely improve.    Thank you!   Increase activity slowly   Complete by: As directed       Signed: Eliezer Bottom, MD  [facility] PGY-2 02/22/2021, 1:38 PM   Pager: [phone]

## 2021-02-22 NOTE — TOC Transition Note (Signed)
Transition of Care [facility]) - CM/SW Discharge Note   Patient Details  Name: Tara Patel MRN: [id number] Date of Birth: Jun 26, 1956  Transition of Care [facility]) CM/SW Contact:  Bess Kinds, RN Phone Number: [phone] 02/22/2021, 4:20 PM   Clinical Narrative:     Spoke with patient at the bedside to discuss transition planning. Patient from home with her brothers. Her daughter to assist her at home once discharged. Discussed recommendations for [facility] PT/OT - patient agreeable. Provided choice list for home health agencies. Referral accepted by [facility]. DC summary, HH orders, and therapy notes faxed to to PCP, Charise Carwin, MD at [phone]. [employer] to deliver RW to home today. Patient's daughter to provide transportation home. No further TOC needs identified at this time.   Final next level of care: Home w Home Health Services Barriers to Discharge: No Barriers Identified   Patient Goals and CMS Choice Patient states their goals for this hospitalization and ongoing recovery are:: return home with family support CMS Medicare.gov Compare Post Acute Care list provided to:: Patient Choice offered to / list presented to : Patient  Discharge Placement                       Discharge Plan and Services In-house Referral: NA Discharge Planning Services: CM Consult Post Acute Care Choice: Durable Medical Equipment,Home Health          DME Arranged: Dan Humphreys rolling DME Agency: Other - Comment Loyal Buba) Date DME Agency Contacted: 02/22/21 Time DME Agency Contacted: [phone] Representative spoke with at DME Agency: Vaughan Basta HH Arranged: PT,OT HH Agency: [facility] Date HH Agency Contacted: 02/22/21 Time HH Agency Contacted: [phone] Representative spoke with at [facility] Agency: Barbara Cower  Social Determinants of Health (SDOH) Interventions     Readmission Risk Interventions No flowsheet data found.

## 2021-02-22 NOTE — Progress Notes (Signed)
Patient discharged to private residence via motor vehicle driven by son.  Escorted to exit via wheelchair by nurse tech.

## 2021-02-24 NOTE — Progress Notes (Unsigned)
[id number] applied in hospital.

## 2021-03-12 ENCOUNTER — Other Ambulatory Visit: Payer: Self-pay | Admitting: Internal Medicine

## 2021-03-12 DIAGNOSIS — E041 Nontoxic single thyroid nodule: Secondary | ICD-10-CM

## 2021-03-16 NOTE — Progress Notes (Deleted)
Cardiology Office Note:    Date:  03/16/2021   ID:  Tara Patel, DOB [birth year], MRN [id number]  PCP:  System, Provider Not In  Cardiologist:  None  Electrophysiologist:  None   Referring MD: No ref. provider found   No chief complaint on file. ***  History of Present Illness:    Tara Patel is a 65 y.o. female with a hx of T2DM, CKD stage III, nonobstructive CAD on cath in 2010, hypertension, hyperlipidemia, asthma, OSA, GERD who presents for hospital follow-up for syncope.  She was admitted from 5/19 through 02/22/2021 with syncope.  Thought to be orthostatic hypotension in setting of dehydration, symptoms improved with IV fluids.  Echocardiogram on 02/24/2021 showed normal biventricular function, grade 1 diastolic dysfunction, no significant valvular disease.  He was discharged with Zio patch x14 days, which showed no significant abnormalities.  Past Medical History:  Diagnosis Date   Asthma    Chest pain    Mild nonobstructive CAD (by cath Aug '10)   CKD (chronic kidney disease) stage 3, GFR 30-59 ml/min (HCC)    Coronary artery disease    Mild, nonobstructive by catheterization 05/23/2009   Depression    Diabetes mellitus    GERD (gastroesophageal reflux disease)    Hypercholesteremia    Hypertension    Normocytic anemia 10/19/2013   Obesity    OSA (obstructive sleep apnea)    CPAP Q HS    Past Surgical History:  Procedure Laterality Date   ABDOMINAL HYSTERECTOMY     ADENOIDECTOMY     NASAL SEPTUM SURGERY     REPLACEMENT TOTAL KNEE     TONSILLECTOMY     TUBAL LIGATION      Current Medications: No outpatient medications have been marked as taking for the 03/18/21 encounter (Appointment) with Little Ishikawa, MD.     Allergies:   Baclofen   Social History   Socioeconomic History   Marital status: Divorced    Spouse name: Not on file   Number of children: 2   Years of education: 13   Highest education level: Not on file  Occupational  History   Occupation: Unemployed.  Tobacco Use   Smoking status: Never   Smokeless tobacco: Never  Substance and Sexual Activity   Alcohol use: Not Currently    Comment: Rare    Drug use: No   Sexual activity: Never  Other Topics Concern   Not on file  Social History Narrative   Divorced.  Lives with father and 2 brothers.  Go[facility] to the [facility] care.   Social Determinants of Health   Financial Resource Strain: Not on file  Food Insecurity: Not on file  Transportation Needs: Not on file  Physical Activity: Not on file  Stress: Not on file  Social Connections: Not on file     Family History: The patient's ***family history includes CAD in her mother; Cancer in her brother, sister, and another family member; Diabetes in her brother, mother, and another family member; Hypertension in her mother.  ROS:   Please see the history of present illness.   All other systems reviewed and are negative.  EKGs/Labs/Other Studies Reviewed:    The following studies were reviewed today:  EKG:  EKG is *** ordered today.  The ekg ordered today demonstrates ***  Recent Labs: 02/21/2021: ALT 39; Magnesium 1.4; TSH 1.030 02/22/2021: BUN 12; Creatinine, Ser 1.13; Hemoglobin 11.4; Platelets 257; Potassium 4.5; Sodium 139  Recent Lipid Panel  Component Value Date/Time   CHOL 141 10/20/2013 0520   TRIG 98 10/20/2013 0520   HDL 45 10/20/2013 0520   CHOLHDL 3.1 10/20/2013 0520   VLDL 20 10/20/2013 0520   LDLCALC 76 10/20/2013 0520    Physical Exam:    VS:  There were no vitals taken for this visit.    Wt Readings from Last 3 Encounters:  02/22/21 174 lb 2.6 oz (79 kg)  09/28/18 216 lb (98 kg)  01/30/14 208 lb (94.3 kg)     GEN: *** Well nourished, well developed in no acute distress HEENT: Normal NECK: No JVD; No carotid bruits LYMPHATICS: No lymphadenopathy CARDIAC: ***RRR, no murmurs, rubs, gallops RESPIRATORY:  Clear to auscultation without rales, wheezing or  rhonchi  ABDOMEN: Soft, non-tender, non-distended MUSCULOSKELETAL:  No edema; No deformity  SKIN: Warm and dry NEUROLOGIC:  Alert and oriented x 3 PSYCHIATRIC:  Normal affect   ASSESSMENT:    No diagnosis found. PLAN:     Syncope:admitted from 5/19 through 02/22/2021 with syncope.  Thought to be orthostatic hypotension in setting of dehydration, symptoms improved with IV fluids.  Echocardiogram on 02/24/2021 showed normal biventricular function, grade 1 diastolic dysfunction, no significant valvular disease.  She was discharged with Zio patch x14 days, which showed no significant abnormalities.  CAD: Nonobstructive CAD on cath in 2010  Hypertension: On amlodipine 10 mg daily and prazosin 2 mg nightly  Hyperlipidemia: On atorvastatin 20 mg daily  OSA: On CPAP  T2DM: On insulin.  A1c 9.5%  RTC in***   Medication Adjustments/Labs and Tests Ordered: Current medicines are reviewed at length with the patient today.  Concerns regarding medicines are outlined above.  No orders of the defined types were placed in this encounter.  No orders of the defined types were placed in this encounter.   There are no Patient Instructions on file for this visit.   Signed, Little Ishikawa, MD  03/16/2021 3:08 PM    [facility]

## 2021-03-18 ENCOUNTER — Ambulatory Visit: Payer: Medicare Other | Admitting: Cardiology

## 2021-03-19 ENCOUNTER — Other Ambulatory Visit (HOSPITAL_COMMUNITY)
Admission: RE | Admit: 2021-03-19 | Discharge: 2021-03-19 | Disposition: A | Discharge status: Home / Self Care | Payer: Medicare Other | Source: Ambulatory Visit | Attending: Student | Admitting: Student

## 2021-03-19 ENCOUNTER — Ambulatory Visit
Admission: RE | Admit: 2021-03-19 | Discharge: 2021-03-19 | Disposition: A | Discharge status: Home / Self Care | Payer: Medicare Other | Source: Ambulatory Visit | Attending: Internal Medicine | Admitting: Internal Medicine

## 2021-03-19 DIAGNOSIS — E041 Nontoxic single thyroid nodule: Secondary | ICD-10-CM

## 2021-03-20 LAB — CYTOLOGY - NON PAP

## 2021-06-09 NOTE — Progress Notes (Deleted)
Cardiology Office Note:   Date:  06/09/2021  NAME:  Tara Patel    MRN: [id number] DOB:  [birth year]   PCP:  System, Provider Not In  Cardiologist:  None  Electrophysiologist:  None   Referring MD: No ref. provider found   No chief complaint on file. ***  History of Present Illness:   Tara Patel is a 65 y.o. female with a hx of DM, HTN, HLD who is being seen today for the evaluation of syncope at the request of [facility]. Admitted in May for syncope. Normal echo. Monitor normal. Symptoms related to orthostasis and dehydration.   Problem List DM -A1c 9.5 HTN HLD -T chol 141, HDL 45, LDL 76, TG 98  Past Medical History: Past Medical History:  Diagnosis Date   Asthma    Chest pain    Mild nonobstructive CAD (by cath Aug '10)   CKD (chronic kidney disease) stage 3, GFR 30-59 ml/min (HCC)    Coronary artery disease    Mild, nonobstructive by catheterization 05/23/2009   Depression    Diabetes mellitus    GERD (gastroesophageal reflux disease)    Hypercholesteremia    Hypertension    Normocytic anemia 10/19/2013   Obesity    OSA (obstructive sleep apnea)    CPAP Q HS    Past Surgical History: Past Surgical History:  Procedure Laterality Date   ABDOMINAL HYSTERECTOMY     ADENOIDECTOMY     NASAL SEPTUM SURGERY     REPLACEMENT TOTAL KNEE     TONSILLECTOMY     TUBAL LIGATION      Current Medications: No outpatient medications have been marked as taking for the 06/10/21 encounter (Appointment) with Sande Rives, MD.     Allergies:    Baclofen   Social History: Social History   Socioeconomic History   Marital status: Divorced    Spouse name: Not on file   Number of children: 2   Years of education: 13   Highest education level: Not on file  Occupational History   Occupation: Unemployed.  Tobacco Use   Smoking status: Never   Smokeless tobacco: Never  Substance and Sexual Activity   Alcohol use: Not Currently    Comment: Rare    Drug use:  No   Sexual activity: Never  Other Topics Concern   Not on file  Social History Narrative   Divorced.  Lives with father and 2 brothers.  Goes to the [facility] for medical care.   Social Determinants of Health   Financial Resource Strain: Not on file  Food Insecurity: Not on file  Transportation Needs: Not on file  Physical Activity: Not on file  Stress: Not on file  Social Connections: Not on file     Family History: The patient's ***family history includes CAD in her mother; Cancer in her brother, sister, and another family member; Diabetes in her brother, mother, and another family member; Hypertension in her mother.  ROS:   All other ROS reviewed and negative. Pertinent positives noted in the HPI.     EKGs/Labs/Other Studies Reviewed:   The following studies were personally reviewed by me today:  EKG:  EKG is *** ordered today.  The ekg ordered today demonstrates ***, and was personally reviewed by me.  Zio 04/02/2021 The dominant rhythm is normal sinus and mild sinus tachycardia. There is preserved circadian variation, but there is a tendency towards high resting heart rates. There are very rare PACs and PVCs and a couple  of very brief episodes of nonsustained atrial tachycardia. There is no atrial fibrillation. There is no significant bradycardia.   Mostly normal arrhythmia monitor, with tendency towards mild sinus tachycardia and higher resting heart rates. Consider anemia, hyperthyroidism, chronic hypovolemia, deconditioning, chronic vasodilator use, etc.   TTE 02/21/2021  1. Left ventricular ejection fraction, by estimation, is 55 to 60%. The  left ventricle has normal function. The left ventricle has no regional  wall motion abnormalities. There is mild concentric left ventricular  hypertrophy. Left ventricular diastolic  parameters are consistent with Grade I diastolic dysfunction (impaired  relaxation).   2. Right ventricular systolic function is normal. The right  ventricular  size is normal. There is normal pulmonary artery systolic pressure.   3. The mitral valve is normal in structure. Trivial mitral valve  regurgitation.   4. The aortic valve is tricuspid. Aortic valve regurgitation is not  visualized. No aortic stenosis is present.   Recent Labs: 02/21/2021: ALT 39; Magnesium 1.4; TSH 1.030 02/22/2021: BUN 12; Creatinine, Ser 1.13; Hemoglobin 11.4; Platelets 257; Potassium 4.5; Sodium 139   Recent Lipid Panel    Component Value Date/Time   CHOL 141 10/20/2013 0520   TRIG 98 10/20/2013 0520   HDL 45 10/20/2013 0520   CHOLHDL 3.1 10/20/2013 0520   VLDL 20 10/20/2013 0520   LDLCALC 76 10/20/2013 0520    Physical Exam:   VS:  There were no vitals taken for this visit.   Wt Readings from Last 3 Encounters:  02/22/21 174 lb 2.6 oz (79 kg)  09/28/18 216 lb (98 kg)  01/30/14 208 lb (94.3 kg)    General: Well nourished, well developed, in no acute distress Head: Atraumatic, normal size  Eyes: PEERLA, EOMI  Neck: Supple, no JVD Endocrine: No thryomegaly Cardiac: Normal S1, S2; RRR; no murmurs, rubs, or gallops Lungs: Clear to auscultation bilaterally, no wheezing, rhonchi or rales  Abd: Soft, nontender, no hepatomegaly  Ext: No edema, pulses 2+ Musculoskeletal: No deformities, BUE and BLE strength normal and equal Skin: Warm and dry, no rashes   Neuro: Alert and oriented to person, place, time, and situation, CNII-XII grossly intact, no focal deficits  Psych: Normal mood and affect   ASSESSMENT:   Tara Patel is a 65 y.o. female who presents for the following: No diagnosis found.  PLAN:   There are no diagnoses linked to this encounter.  {Are you ordering a CV Procedure (e.g. stress test, cath, DCCV, TEE, etc)?   Press F2        :[id number]}  Disposition: No follow-ups on file.  Medication Adjustments/Labs and Tests Ordered: Current medicines are reviewed at length with the patient today.  Concerns regarding medicines are  outlined above.  No orders of the defined types were placed in this encounter.  No orders of the defined types were placed in this encounter.   There are no Patient Instructions on file for this visit.   Time Spent with Patient: I have spent a total of *** minutes with patient reviewing hospital notes, telemetry, EKGs, labs and examining the patient as well as establishing an assessment and plan that was discussed with the patient.  > 50% of time was spent in direct patient care.  Signed, Lenna Gilford. Flora Lipps, MD, [facility]  [facility]  [address] [place], [place] [address] [phone]  06/09/2021 1:14 PM

## 2021-06-10 ENCOUNTER — Ambulatory Visit: Payer: Medicare Other | Admitting: Cardiovascular Disease

## 2021-06-10 DIAGNOSIS — R55 Syncope and collapse: Secondary | ICD-10-CM

## 2022-03-14 IMAGING — DX DG CHEST 1V PORT
1 series · 1 of 1 positions shown · non-contrast
Comparison: 07/23/2019

CLINICAL DATA: Syncope, fall

EXAM:
PORTABLE CHEST 1 VIEW

[chest]
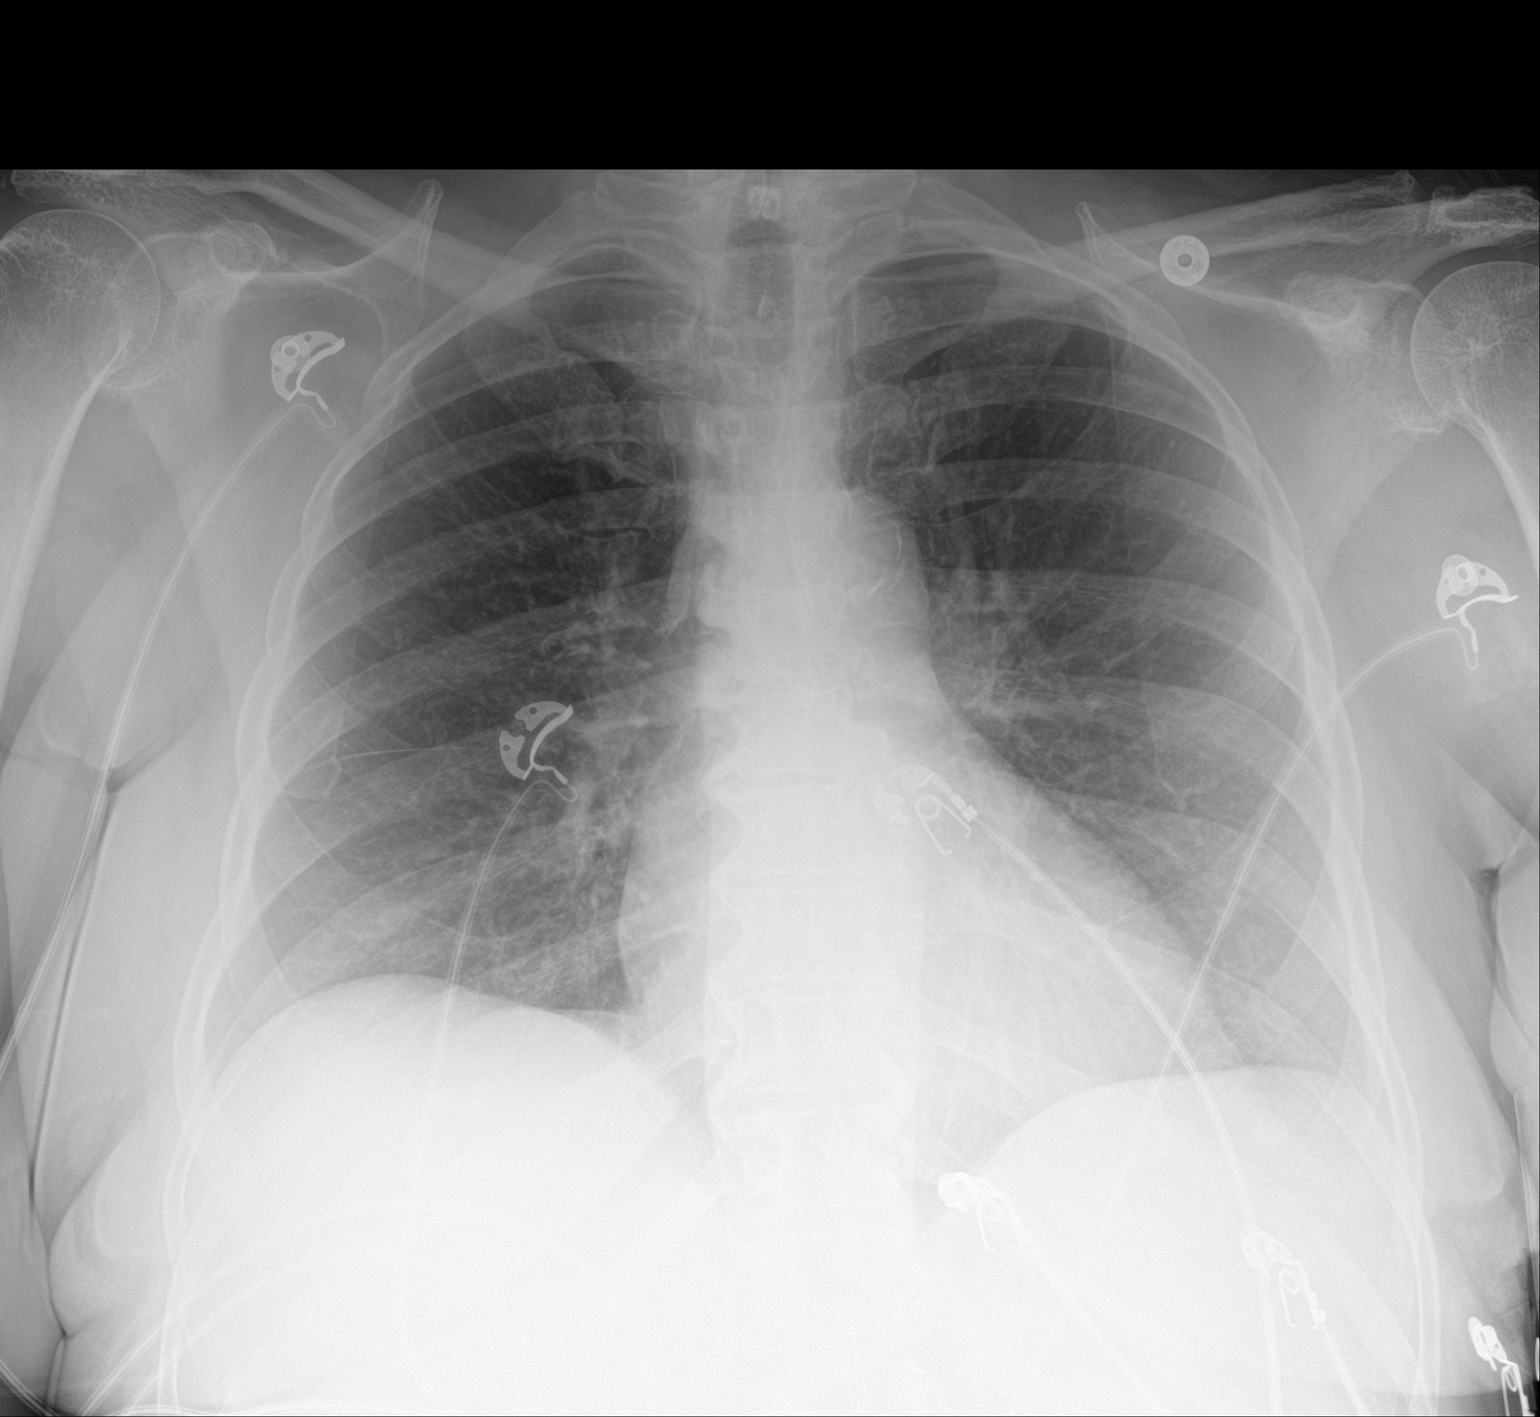

[1 of 1 positions shown; findings below may reference images not displayed]

FINDINGS: The heart size and mediastinal contours are within normal limits.
Both lungs are clear. The visualized skeletal structures are
unremarkable.
IMPRESSION: No acute abnormality of the lungs in AP portable projection.

## 2022-03-14 IMAGING — CT CT CERVICAL SPINE W/O CM
3 of 4 series · 12 of 33 positions shown, 14 images · non-contrast
Comparison: Prior head CT examination 05/05/2004.

CLINICAL DATA: Focal neuro deficit, greater than 6 hours, stroke
suspected. Neck trauma. Fall.

EXAM:
CT HEAD WITHOUT CONTRAST
CT CERVICAL SPINE WITHOUT CONTRAST
TECHNIQUE: Multidetector CT imaging of the head and cervical spine was
performed following the standard protocol without intravenous
contrast. Multiplanar CT image reconstructions of the cervical spine
were also generated.

[Series 8: sag bone · sagittal · 0.24mm/px · 5 of 81 slices shown, 6 images]
[im 27/81  bone]
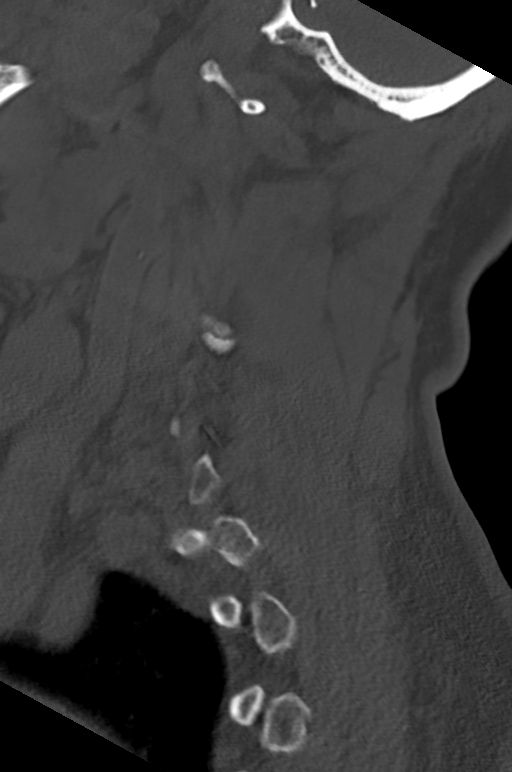
[im 34/81  bone]
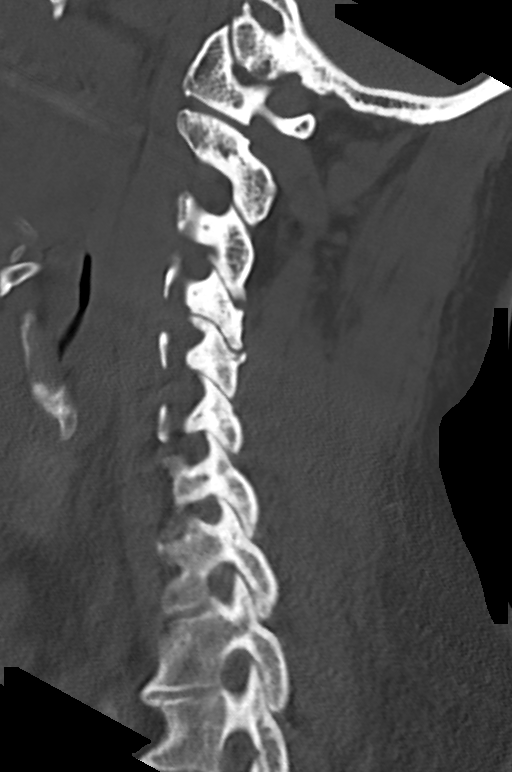
[im 41/81  soft-tissue]
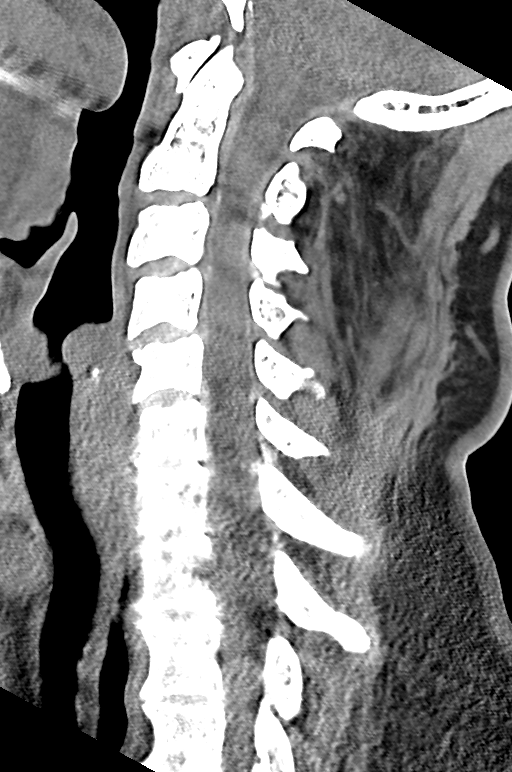
[im 41/81  bone]
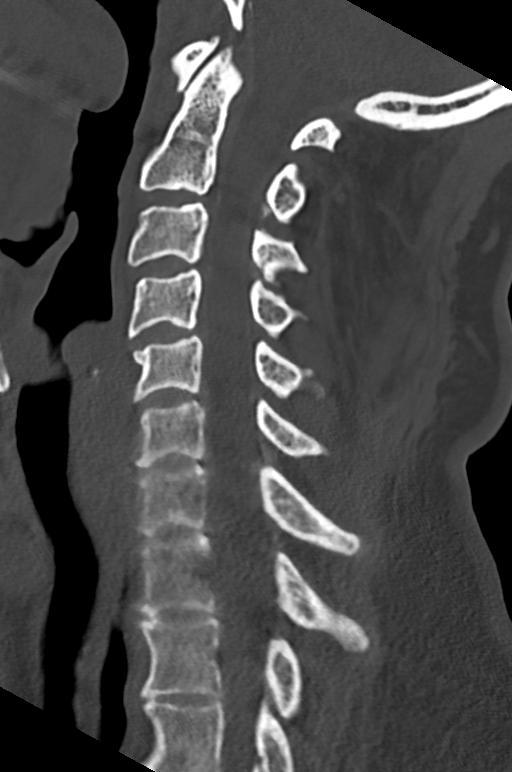
[im 47/81  bone]
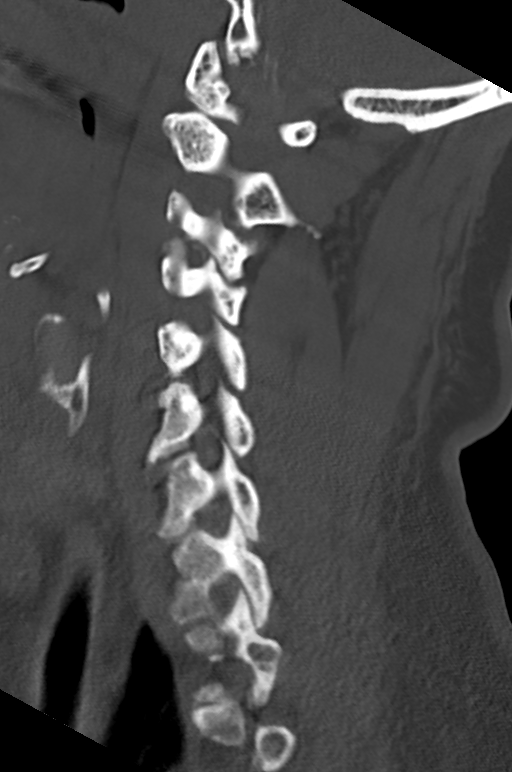
[im 54/81  bone]
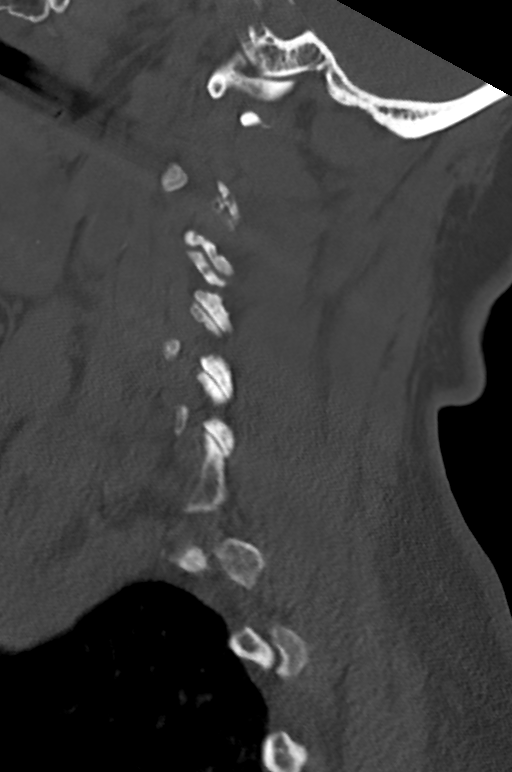

[Series 9: cor bone · coronal · 0.27mm/px · 3 of 54 slices shown]
[im 12/54  bone]
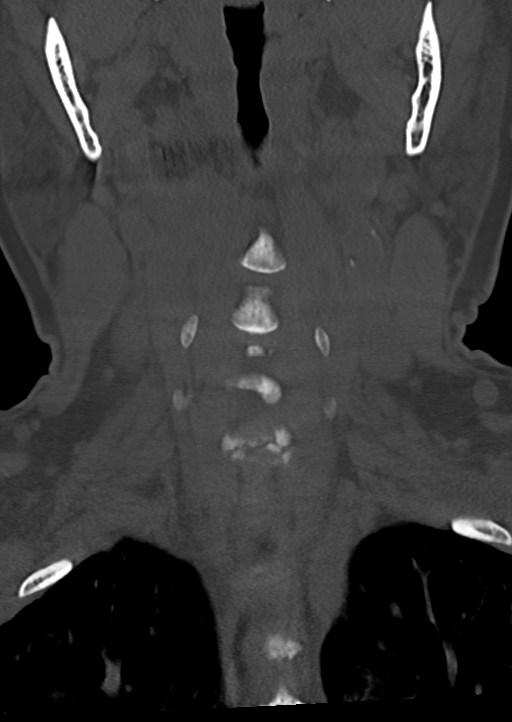
[im 22/54  bone]
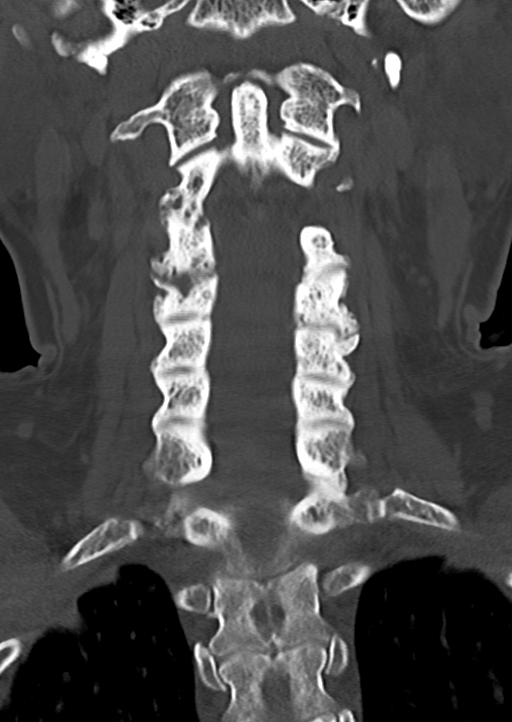
[im 32/54  bone]
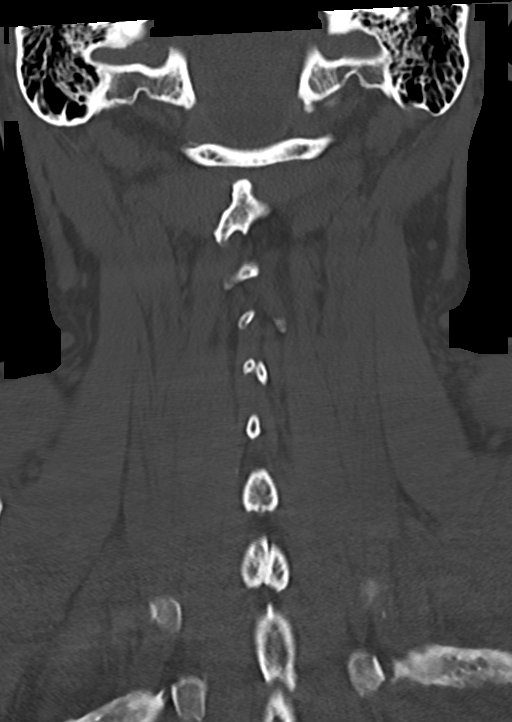

[Series 10: orthogonal axials · axial · 0.21mm/px · z∈[-309,-187]mm · 4 of 100 slices shown, 5 images]
[im 17/100  soft-tissue]
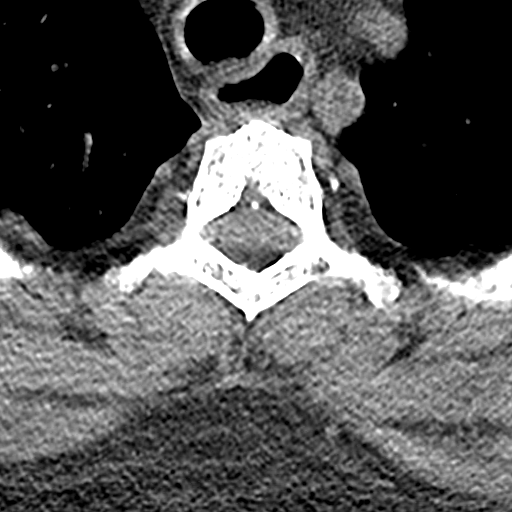
[im 17/100  bone]
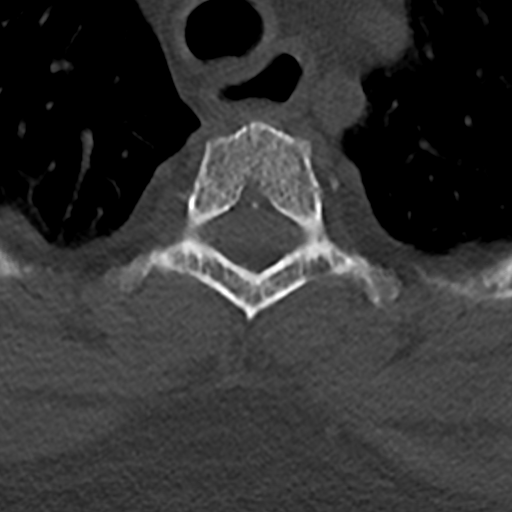
[im 34/100  bone]
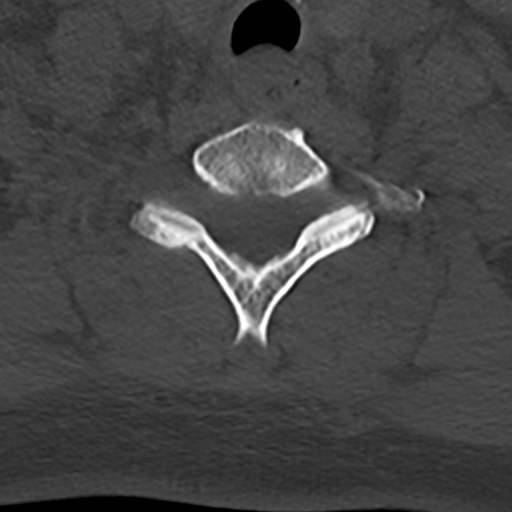
[im 67/100  bone]
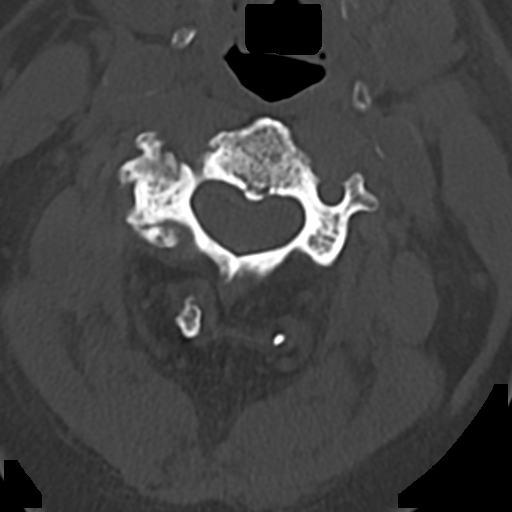
[im 83/100  bone]
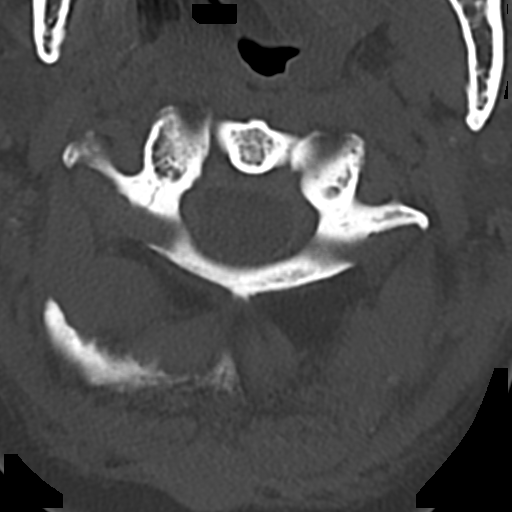

[12 of 33 positions shown; findings below may reference images not displayed]

FINDINGS: CT HEAD FINDINGS

Brain:

Cerebral volume is normal.

There is no acute intracranial hemorrhage.

No demarcated cortical infarct.

No extra-axial fluid collection.

No evidence of intracranial mass.

No midline shift.

Vascular: No hyperdense vessel.  Atherosclerotic calcifications.

Skull: Normal. Negative for fracture or focal lesion.

Sinuses/Orbits: Visualized orbits show no acute finding. Trace
bilateral ethmoid and maxillary sinus mucosal thickening at the
imaged levels.

CT CERVICAL SPINE FINDINGS

Alignment: Cervicothoracic dextrocurvature. Straightening of the
expected cervical lordosis. Trace C3-C4 and C4-C5 grade 1
anterolisthesis.

Skull base and vertebrae: The basion-dental and atlanto-dental
intervals are maintained.No evidence of acute fracture to the
cervical spine.

Soft tissues and spinal canal: No prevertebral fluid or swelling. No
visible canal hematoma.

Disc levels: Cervical spondylosis. No more than mild disc space
narrowing. Multilevel disc bulges/central disc protrusions. Mild
multilevel uncovertebral hypertrophy. Multilevel facet arthrosis. No
appreciable high-grade spinal canal stenosis. Uncovertebral
hypertrophy results in bony neural foraminal narrowing on the right
at C3-C4.

Upper chest: No consolidation within the imaged lung apices. No
visible pneumothorax.

Other: 1.7 cm right thyroid lobe nodule.
IMPRESSION: CT head:

1. No evidence of acute intracranial abnormality.
2. Carotid and vertebral artery atherosclerotic calcification.
3. Mild paranasal sinus disease, as described.

CT cervical spine:

1. No evidence of acute fracture to the cervical spine.
2. Nonspecific straightening of the expected cervical lordosis.
3. Cervicothoracic dextrocurvature.
4. Trace C3-C4 and C4-C5 grade 1 anterolisthesis.
5. 1.7 cm right thyroid lobe nodule. Nonemergent thyroid ultrasound
recommended for further evaluation.

## 2022-03-14 IMAGING — CT CT HEAD W/O CM
4 series · 15 of 47 positions shown, 17 images · non-contrast
Comparison: Prior head CT examination 05/05/2004.

CLINICAL DATA: Focal neuro deficit, greater than 6 hours, stroke
suspected. Neck trauma. Fall.

EXAM:
CT HEAD WITHOUT CONTRAST
CT CERVICAL SPINE WITHOUT CONTRAST
TECHNIQUE: Multidetector CT imaging of the head and cervical spine was
performed following the standard protocol without intravenous
contrast. Multiplanar CT image reconstructions of the cervical spine
were also generated.

[Series 3: head wo · axial · 0.43mm/px · z∈[-150,-35]mm · 7 of 31 slices shown, 9 images]
[im 4/31  brain]
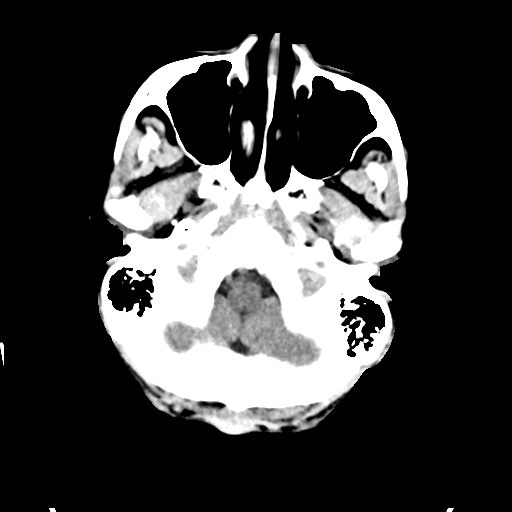
[im 4/31  bone]
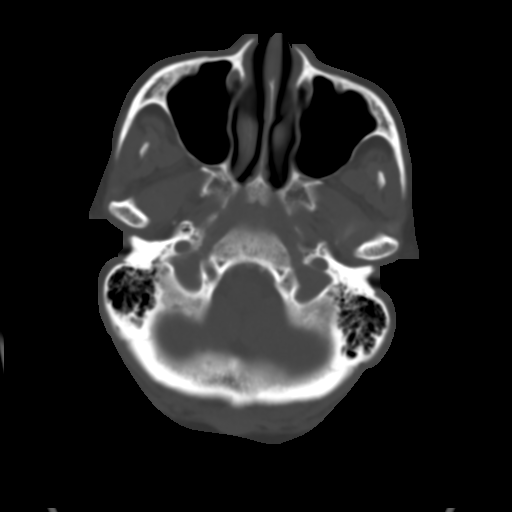
[im 8/31  brain]
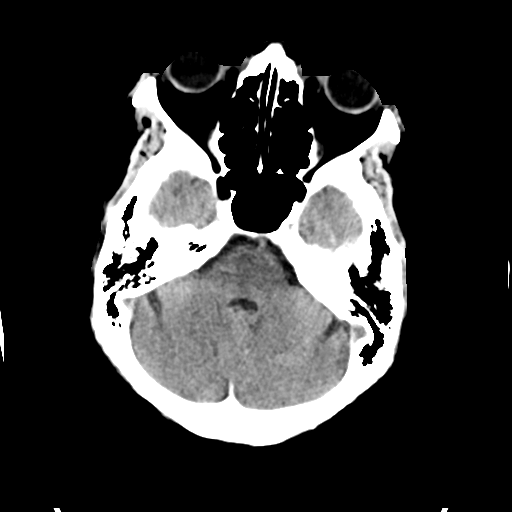
[im 12/31  brain]
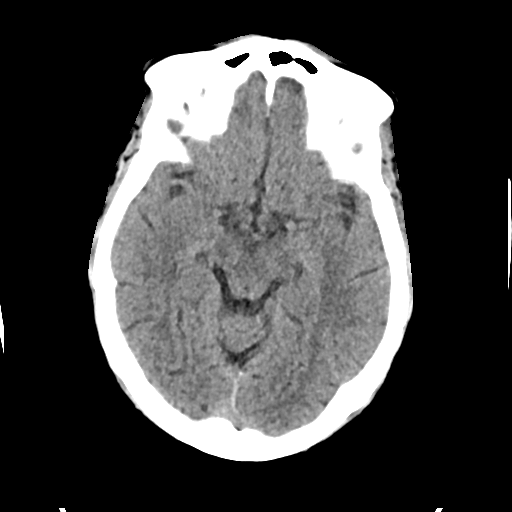
[im 16/31  brain]
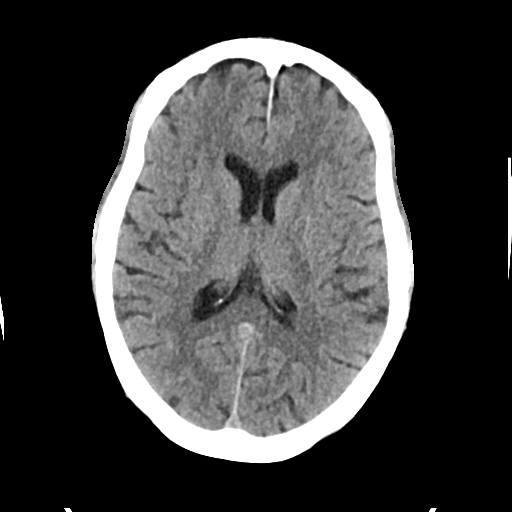
[im 19/31  brain]
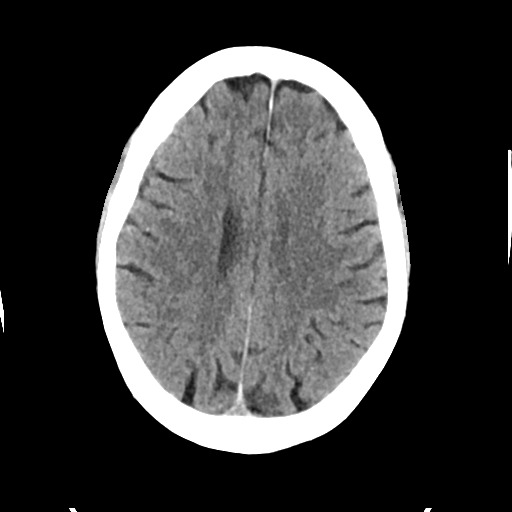
[im 19/31  bone]
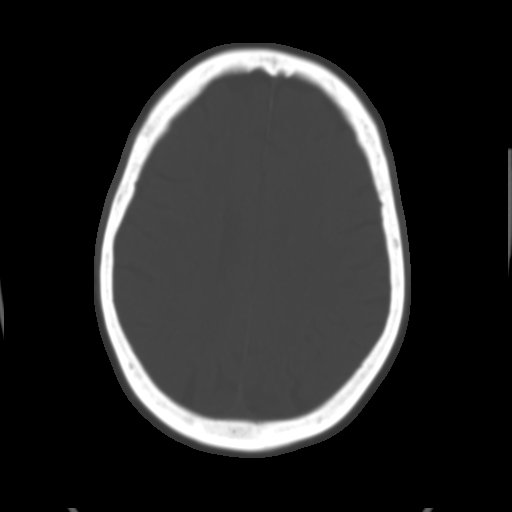
[im 23/31  brain]
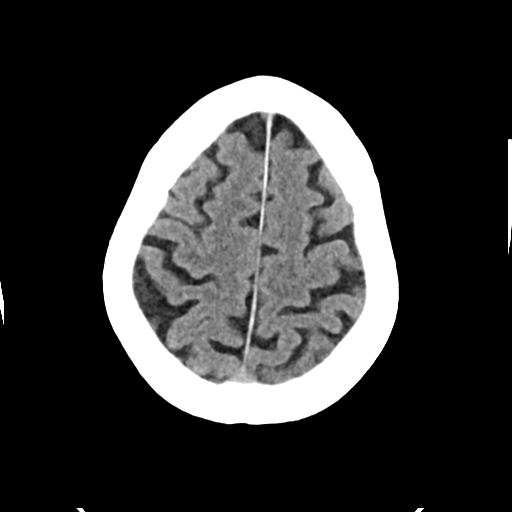
[im 27/31  brain]
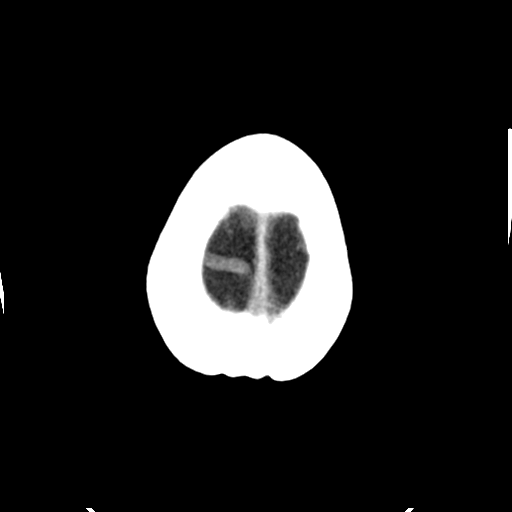

[Series 4: head bone · axial · 0.43mm/px · z∈[-151,-135]mm · 2 of 76 slices shown]
[im 8/76  bone]
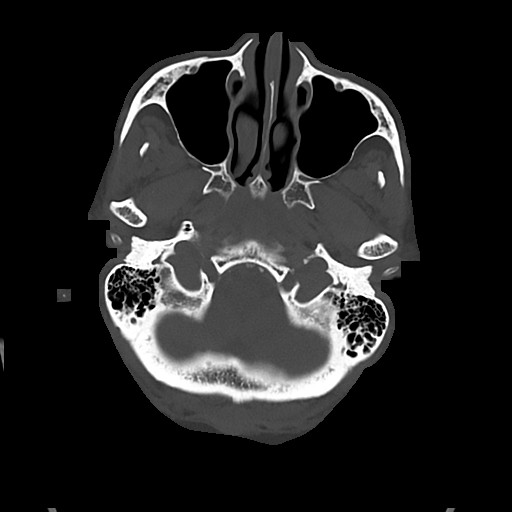
[im 16/76  bone]
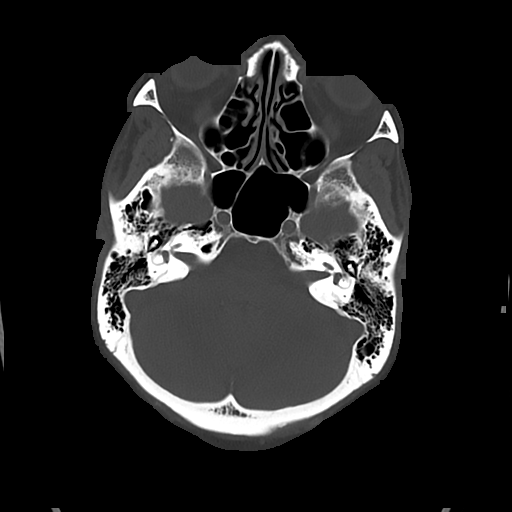

[Series 5: cor soft · coronal · 0.31mm/px · 3 of 69 slices shown]
[im 23/69  brain]
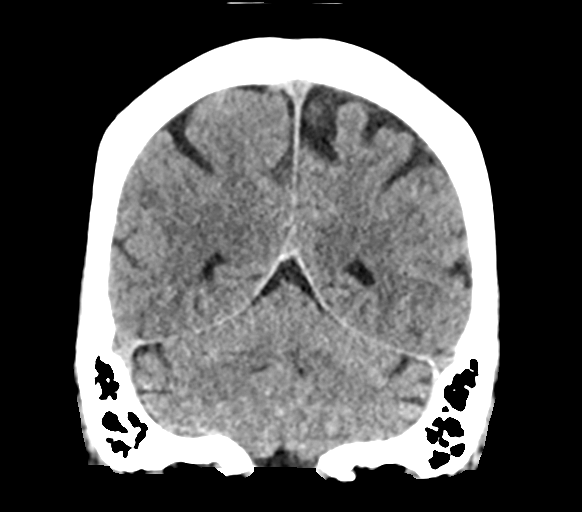
[im 31/69  brain]
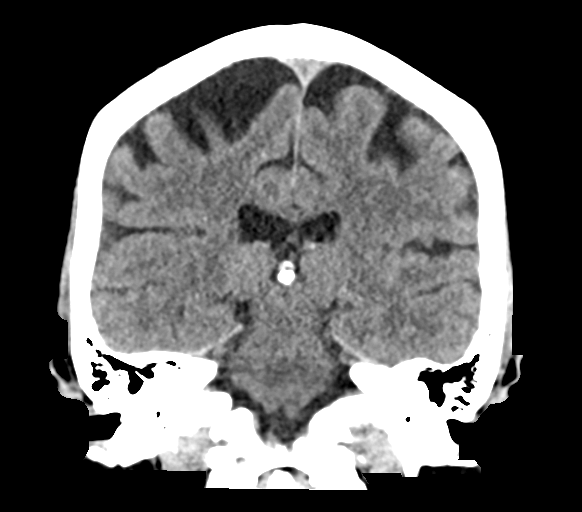
[im 38/69  brain]
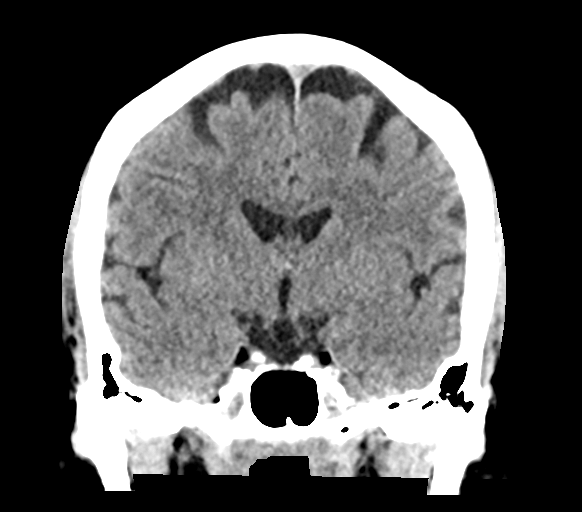

[Series 6: sag soft · sagittal · 0.30mm/px · 3 of 61 slices shown]
[im 21/61  brain]
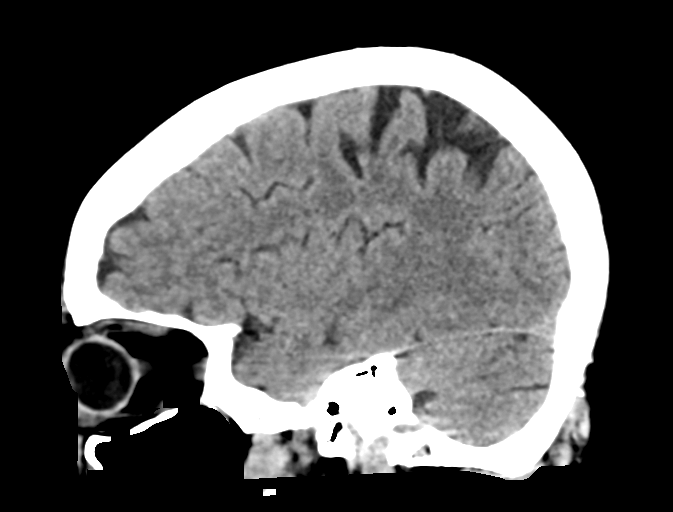
[im 31/61  brain]
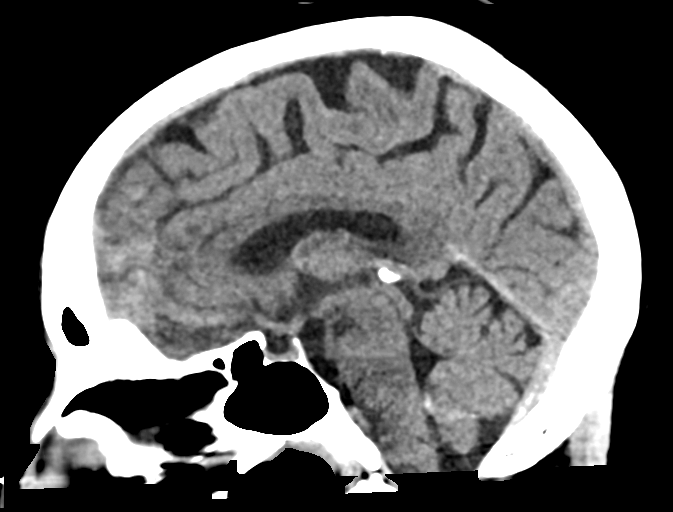
[im 41/61  brain]
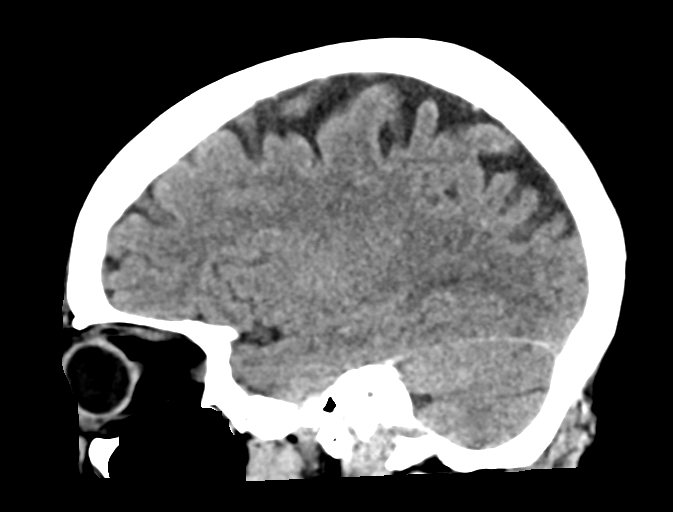

[15 of 47 positions shown; findings below may reference images not displayed]

FINDINGS: CT HEAD FINDINGS

Brain:

Cerebral volume is normal.

There is no acute intracranial hemorrhage.

No demarcated cortical infarct.

No extra-axial fluid collection.

No evidence of intracranial mass.

No midline shift.

Vascular: No hyperdense vessel.  Atherosclerotic calcifications.

Skull: Normal. Negative for fracture or focal lesion.

Sinuses/Orbits: Visualized orbits show no acute finding. Trace
bilateral ethmoid and maxillary sinus mucosal thickening at the
imaged levels.

CT CERVICAL SPINE FINDINGS

Alignment: Cervicothoracic dextrocurvature. Straightening of the
expected cervical lordosis. Trace C3-C4 and C4-C5 grade 1
anterolisthesis.

Skull base and vertebrae: The basion-dental and atlanto-dental
intervals are maintained.No evidence of acute fracture to the
cervical spine.

Soft tissues and spinal canal: No prevertebral fluid or swelling. No
visible canal hematoma.

Disc levels: Cervical spondylosis. No more than mild disc space
narrowing. Multilevel disc bulges/central disc protrusions. Mild
multilevel uncovertebral hypertrophy. Multilevel facet arthrosis. No
appreciable high-grade spinal canal stenosis. Uncovertebral
hypertrophy results in bony neural foraminal narrowing on the right
at C3-C4.

Upper chest: No consolidation within the imaged lung apices. No
visible pneumothorax.

Other: 1.7 cm right thyroid lobe nodule.
IMPRESSION: CT head:

1. No evidence of acute intracranial abnormality.
2. Carotid and vertebral artery atherosclerotic calcification.
3. Mild paranasal sinus disease, as described.

CT cervical spine:

1. No evidence of acute fracture to the cervical spine.
2. Nonspecific straightening of the expected cervical lordosis.
3. Cervicothoracic dextrocurvature.
4. Trace C3-C4 and C4-C5 grade 1 anterolisthesis.
5. 1.7 cm right thyroid lobe nodule. Nonemergent thyroid ultrasound
recommended for further evaluation.

## 2022-03-14 IMAGING — US US THYROID
1 series · 13 of 25 positions shown · non-contrast
Comparison: None.

CLINICAL DATA: Incidental on CT.  Thyroid nodule

EXAM:
THYROID ULTRASOUND
TECHNIQUE: Ultrasound examination of the thyroid gland and adjacent soft
tissues was performed.

[Series 1: us thyroid · 13 of 38 slices shown]
[im 1/38]
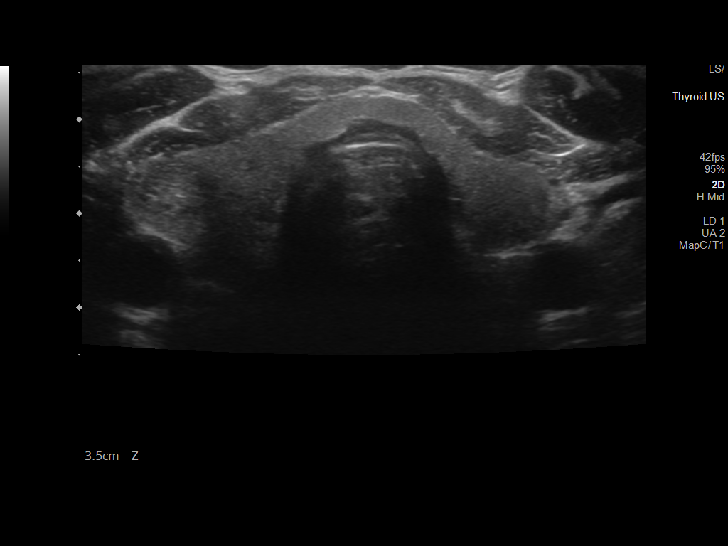
[im 4/38]
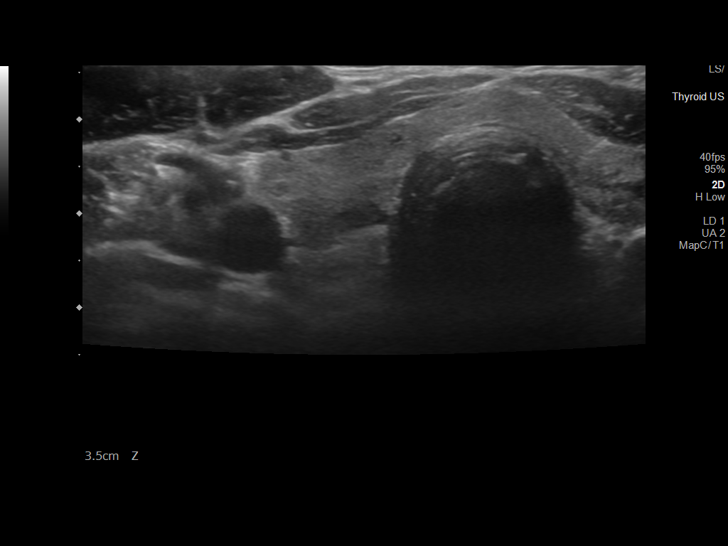
[im 7/38]
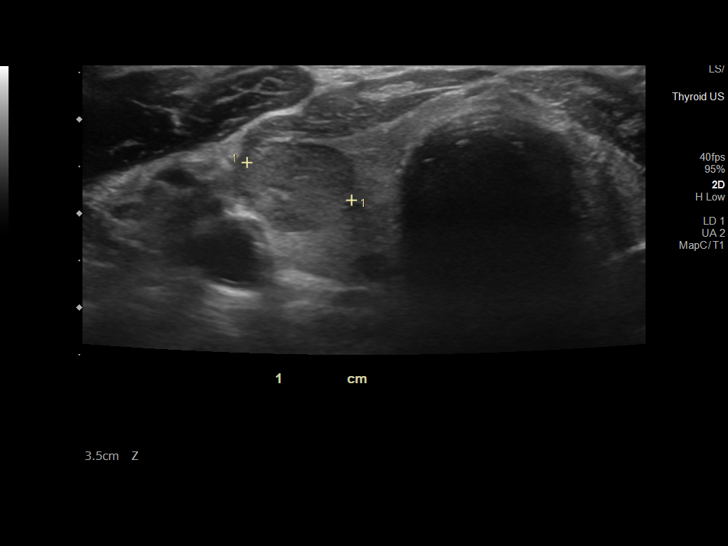
[im 10/38]
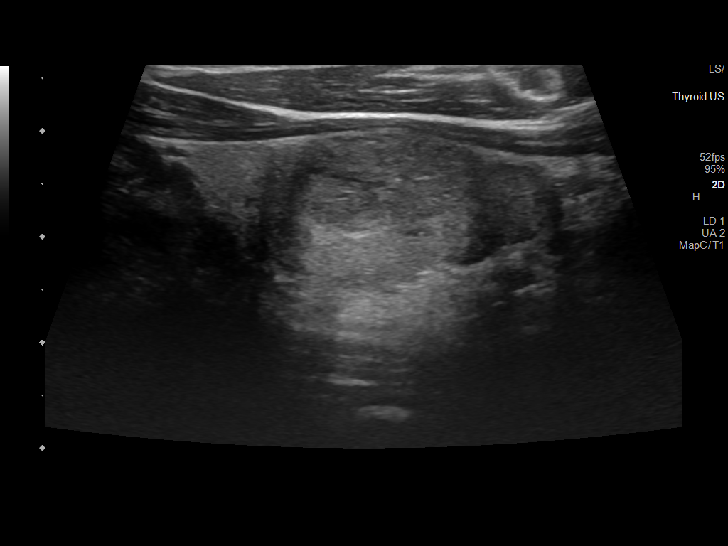
[im 13/38]
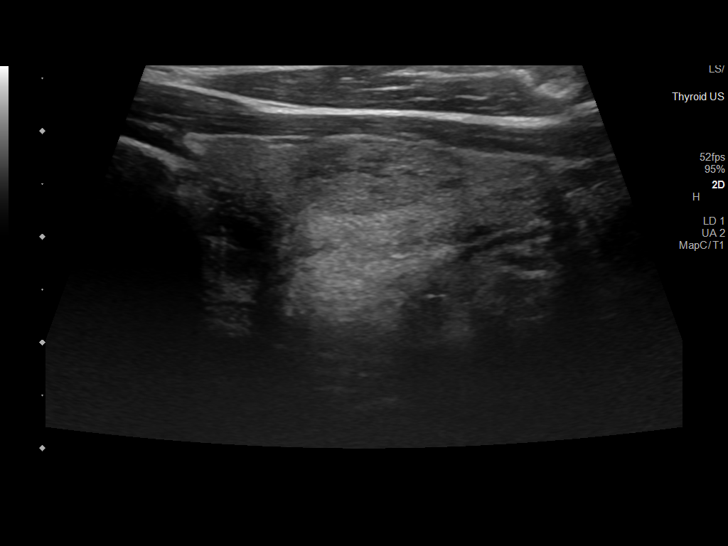
[im 16/38]
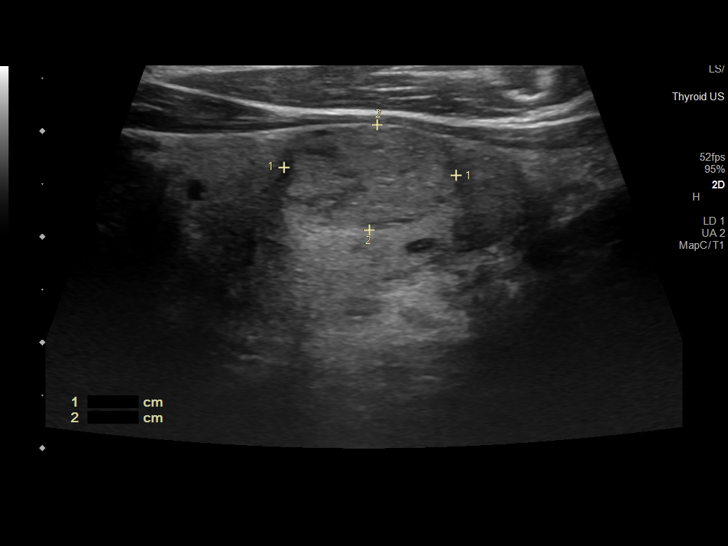
[im 19/38]
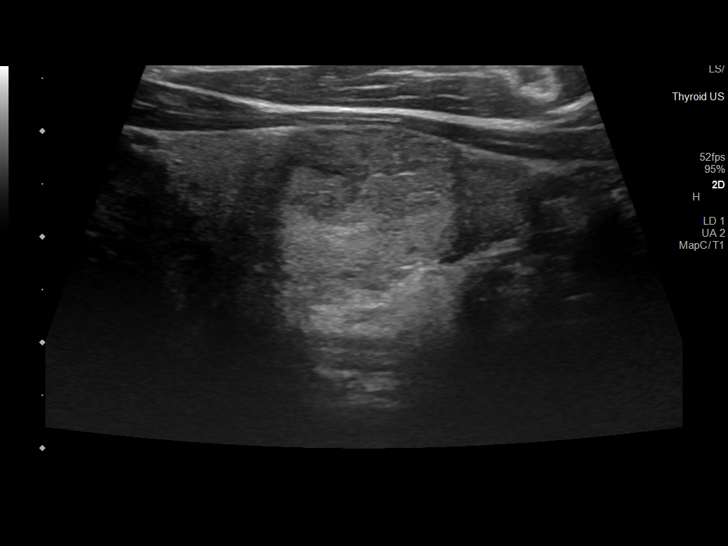
[im 22/38]
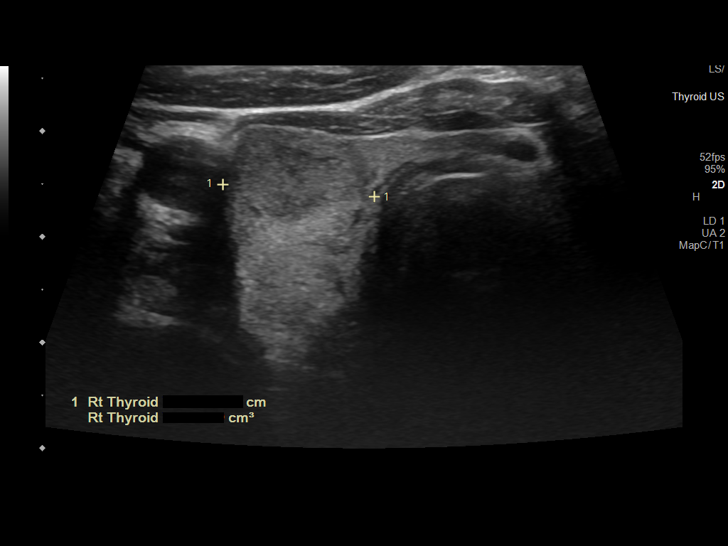
[im 25/38]
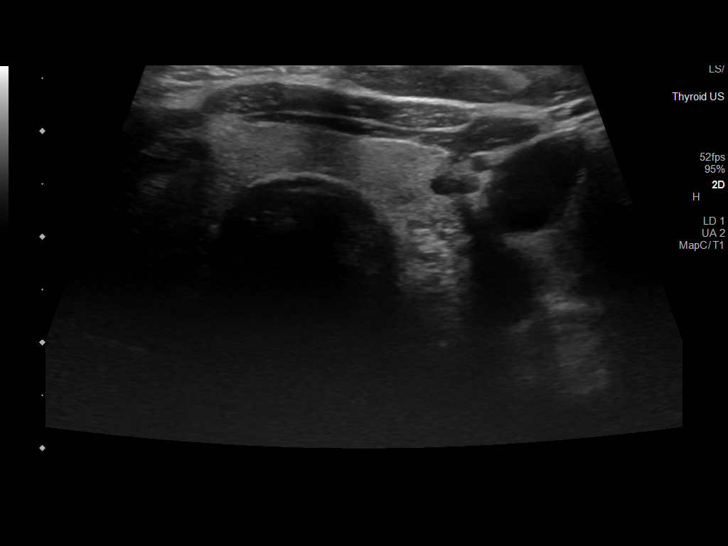
[im 28/38]
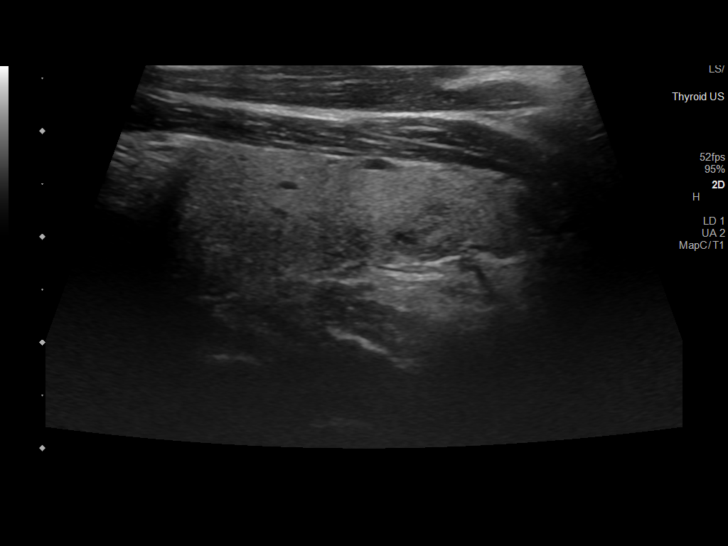
[im 31/38]
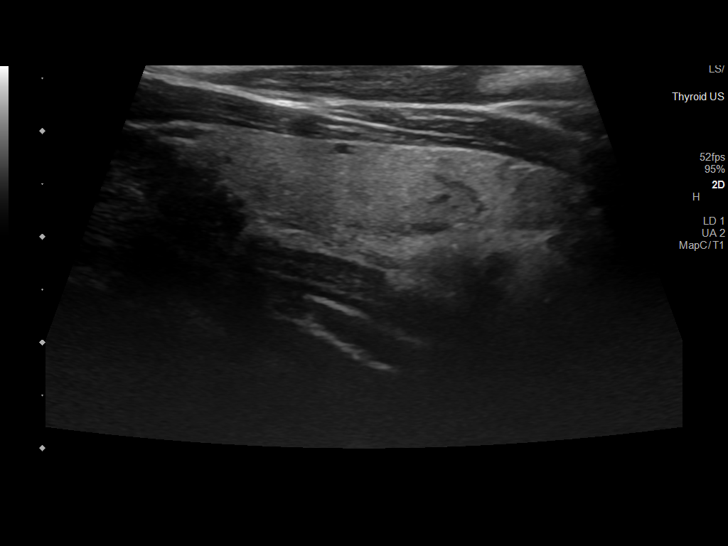
[im 34/38]
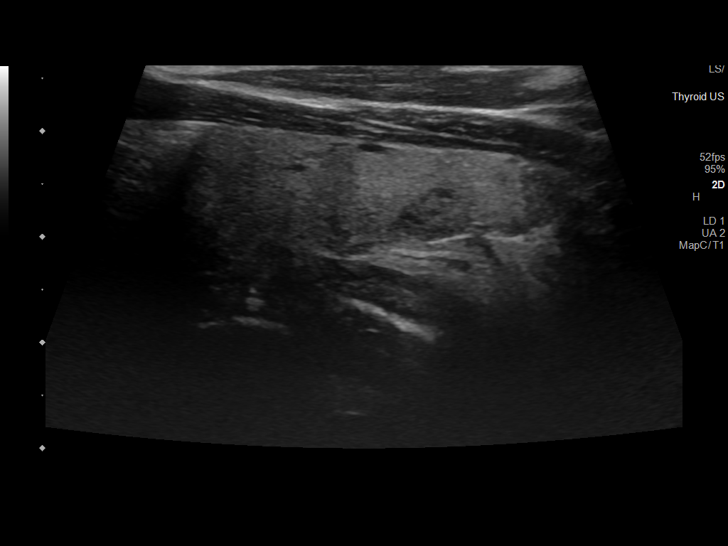
[im 38/38]
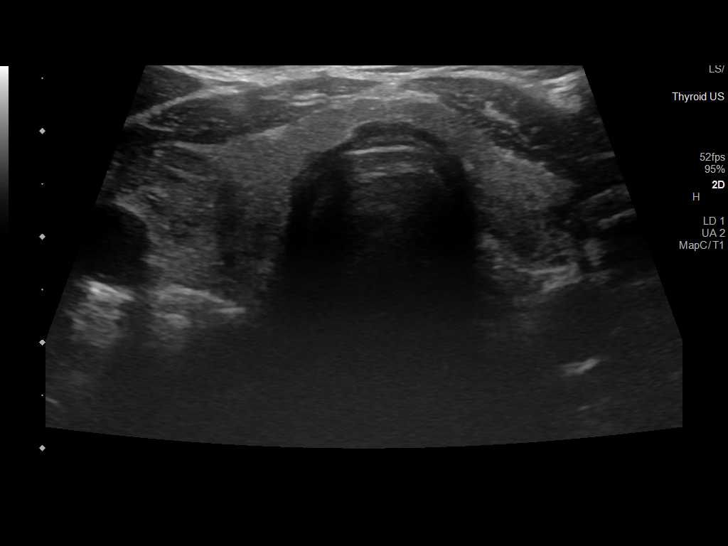

[13 of 25 positions shown; findings below may reference images not displayed]

FINDINGS: Parenchymal Echotexture: Normal

Isthmus: 3 mm

Right lobe: 3.6 x 1.6 x 1.4 cm

Left lobe: 3.5 x 1.1 x 1.2 cm

Nodule # 1:

Location: Right; Mid

Maximum size: 1.6 cm; Other 2 dimensions: 1.0 x 1.2 cm

Composition: solid/almost completely solid (2)

Echogenicity: hypoechoic (2)

Shape: not taller-than-wide (0)

Margins: smooth (0)

Echogenic foci: none (0)

ACR TI-RADS total points: 4.

ACR TI-RADS risk category: TR4 (4-6 points).

ACR TI-RADS recommendations:

Moderately suspicious thyroid nodule. Fine-needle aspiration is
recommended.

_________________________________________________________

_________________________________________________________

Estimated total number of nodules >/= 1 cm: 1

Number of spongiform nodules >/=  2 cm not described below (TR1): 0

Number of mixed cystic and solid nodules >/= 1.5 cm not described
below (TR2): 0

_________________________________________________________

No discrete nodules are seen within the thyroid gland.
IMPRESSION: Solitary tirads 4 thyroid nodule within the mid right thyroid lobe.
Given its size and imaging characteristics, tissue sampling is
recommended.

The above is in keeping with the ACR TI-RADS recommendations - [HOSPITAL] 3223;[DATE].

## 2023-03-23 DIAGNOSIS — R059 Cough, unspecified: Secondary | ICD-10-CM | POA: Diagnosis not present

## 2023-03-23 DIAGNOSIS — I10 Essential (primary) hypertension: Secondary | ICD-10-CM | POA: Diagnosis not present

## 2023-03-23 DIAGNOSIS — R509 Fever, unspecified: Secondary | ICD-10-CM | POA: Diagnosis not present

## 2023-03-23 DIAGNOSIS — R110 Nausea: Secondary | ICD-10-CM | POA: Diagnosis not present

## 2023-03-23 DIAGNOSIS — I959 Hypotension, unspecified: Secondary | ICD-10-CM | POA: Diagnosis not present

## 2024-02-02 ENCOUNTER — Encounter (HOSPITAL_COMMUNITY): Payer: Self-pay

## 2024-02-02 ENCOUNTER — Emergency Department (HOSPITAL_COMMUNITY)
Admission: EM | Admit: 2024-02-02 | Discharge: 2024-02-02 | Disposition: A | Discharge status: Home / Self Care | Attending: Emergency Medicine | Admitting: Emergency Medicine

## 2024-02-02 ENCOUNTER — Other Ambulatory Visit: Payer: Self-pay

## 2024-02-02 DIAGNOSIS — R42 Dizziness and giddiness: Secondary | ICD-10-CM | POA: Diagnosis present

## 2024-02-02 DIAGNOSIS — E871 Hypo-osmolality and hyponatremia: Secondary | ICD-10-CM | POA: Diagnosis not present

## 2024-02-02 DIAGNOSIS — Z794 Long term (current) use of insulin: Secondary | ICD-10-CM | POA: Diagnosis not present

## 2024-02-02 DIAGNOSIS — R739 Hyperglycemia, unspecified: Secondary | ICD-10-CM

## 2024-02-02 DIAGNOSIS — Z91148 Patient's other noncompliance with medication regimen for other reason: Secondary | ICD-10-CM | POA: Diagnosis not present

## 2024-02-02 DIAGNOSIS — E119 Type 2 diabetes mellitus without complications: Secondary | ICD-10-CM

## 2024-02-02 DIAGNOSIS — E1165 Type 2 diabetes mellitus with hyperglycemia: Secondary | ICD-10-CM | POA: Diagnosis not present

## 2024-02-02 LAB — URINALYSIS, ROUTINE W REFLEX MICROSCOPIC
Bacteria, UA: NONE SEEN
Bilirubin Urine: NEGATIVE
Glucose, UA: >=500 mg/dL — AB
Ketones, ur: 5 mg/dL — AB
Nitrite: NEGATIVE
Protein, ur: NEGATIVE mg/dL
Specific Gravity, Urine: 1.027 (ref 1.005–1.030)
WBC, UA: >50 WBC/hpf (ref 0–5)
pH: 5 (ref 5.0–8.0)

## 2024-02-02 LAB — COMPREHENSIVE METABOLIC PANEL WITH GFR
ALT: 22 U/L (ref 0–44)
AST: 22 U/L (ref 15–41)
Albumin: 3.3 g/dL — ABNORMAL LOW (ref 3.5–5.0)
Alkaline Phosphatase: 152 U/L — ABNORMAL HIGH (ref 38–126)
Anion gap: 7 (ref 5–15)
BUN: 26 mg/dL — ABNORMAL HIGH (ref 8–23)
CO2: 27 mmol/L (ref 22–32)
Calcium: 9.3 mg/dL (ref 8.9–10.3)
Chloride: 97 mmol/L — ABNORMAL LOW (ref 98–111)
Creatinine, Ser: 1.44 mg/dL — ABNORMAL HIGH (ref 0.44–1.00)
GFR, Estimated: 39 mL/min — ABNORMAL LOW (ref >60)
Glucose, Bld: 600 mg/dL (ref 70–99)
Potassium: 4.7 mmol/L (ref 3.5–5.1)
Sodium: 131 mmol/L — ABNORMAL LOW (ref 135–145)
Total Bilirubin: 0.4 mg/dL (ref 0.0–1.2)
Total Protein: 6.8 g/dL (ref 6.5–8.1)

## 2024-02-02 LAB — CBC
HCT: 34.7 % — ABNORMAL LOW (ref 36.0–46.0)
Hemoglobin: 11 g/dL — ABNORMAL LOW (ref 12.0–15.0)
MCH: 25.6 pg — ABNORMAL LOW (ref 26.0–34.0)
MCHC: 31.7 g/dL (ref 30.0–36.0)
MCV: 80.9 fL (ref 80.0–100.0)
Platelets: 228 10*3/uL (ref 150–400)
RBC: 4.29 MIL/uL (ref 3.87–5.11)
RDW: 12.9 % (ref 11.5–15.5)
WBC: 7.5 10*3/uL (ref 4.0–10.5)
nRBC: 0 % (ref 0.0–0.2)

## 2024-02-02 LAB — HEMOGLOBIN A1C
Hgb A1c MFr Bld: 14.3 % — ABNORMAL HIGH (ref 4.8–5.6)
Mean Plasma Glucose: 363.71 mg/dL

## 2024-02-02 LAB — CBG MONITORING, ED
Glucose-Capillary: 549 mg/dL (ref 70–99)
Glucose-Capillary: 480 mg/dL — ABNORMAL HIGH (ref 70–99)
Glucose-Capillary: 426 mg/dL — ABNORMAL HIGH (ref 70–99)
Glucose-Capillary: 358 mg/dL — ABNORMAL HIGH (ref 70–99)

## 2024-02-02 MED ORDER — LACTATED RINGERS IV BOLUS
1000.0000 mL | Freq: Once | INTRAVENOUS | Status: AC
  Administered 2024-02-02: 1000 mL via INTRAVENOUS

## 2024-02-02 MED ORDER — INSULIN GLARGINE 100 UNIT/ML ~~LOC~~ SOLN: 20.0000 [IU] | Freq: Two times a day (BID) | SUBCUTANEOUS | 11 refills | Status: DC

## 2024-02-02 MED ORDER — INSULIN ASPART 100 UNIT/ML IJ SOLN
5.0000 [IU] | Freq: Once | INTRAMUSCULAR | Status: AC
  Administered 2024-02-02: 5 [IU] via SUBCUTANEOUS
  Filled 2024-02-02: qty 0.05

## 2024-02-02 MED ORDER — INSULIN ASPART 100 UNIT/ML IJ SOLN
14.0000 [IU] | Freq: Once | INTRAMUSCULAR | Status: AC
  Administered 2024-02-02: 14 [IU] via SUBCUTANEOUS
  Filled 2024-02-02: qty 0.14

## 2024-02-02 MED ORDER — HUMALOG KWIKPEN 100 UNIT/ML ~~LOC~~ SOPN: 15.0000 [IU] | PEN_INJECTOR | SUBCUTANEOUS | 0 refills | Status: DC

## 2024-02-02 MED ORDER — HUMALOG KWIKPEN 100 UNIT/ML ~~LOC~~ SOPN: 15.0000 [IU] | PEN_INJECTOR | SUBCUTANEOUS | 0 refills | Status: AC

## 2024-02-02 MED ORDER — SODIUM CHLORIDE 0.9 % IV SOLN
1.0000 g | Freq: Once | INTRAVENOUS | Status: AC
  Administered 2024-02-02: 1 g via INTRAVENOUS
  Filled 2024-02-02: qty 10

## 2024-02-02 MED ORDER — GLUCOSE BLOOD VI STRP: 1.0000 | ORAL_STRIP | Freq: Three times a day (TID) | 0 refills | Status: DC

## 2024-02-02 MED ORDER — SODIUM CHLORIDE 0.9 % IV BOLUS
500.0000 mL | Freq: Once | INTRAVENOUS | Status: AC
  Administered 2024-02-02: 500 mL via INTRAVENOUS

## 2024-02-02 NOTE — Discharge Instructions (Addendum)
 It was our pleasure to provide your ER care today - we hope that you feel better.  Drink plenty of water/fluids/stay well hydrated. Check blood sugar 4x/day (before meals and at bedtime), and keep a record of values. Follow diabetes meal plan and take diabetes meds/insulin  as prescribed by your doctor.   Follow up closely with your doctor in the next 2-3 days - call office to arrange appointment.   Return to ER if worse, new symptoms, fevers, new/severe pain, chest pain, trouble breathing, persistent vomiting, or other concern.

## 2024-02-02 NOTE — Inpatient Diabetes Management (Signed)
 Inpatient Diabetes Program Recommendations  AACE/ADA: New Consensus Statement on Inpatient Glycemic Control (2015)  Target Ranges:  Prepandial:   less than 140 mg/dL      Peak postprandial:   less than 180 mg/dL (1-2 hours)      Critically ill patients:  140 - 180 mg/dL   Lab Results  Component Value Date   GLUCAP 549 (HH) 02/02/2024   HGBA1C 9.5 (H) 02/21/2021    Review of Glycemic Control  Diabetes history: DM2 Outpatient Diabetes medications: Lantus  20 BID, Humalog 15 units if sugar > 200 Current orders for Inpatient glycemic control: Received 14 units of Novolog  1x dose  HgbA1C - 9.5%  Inpatient Diabetes Program Recommendations:    Consider IV insulin  per [employer] for Hyperglycemia  Will speak with pt regarding her HgbA1C in am.  Continue to follow.  Thank you. Joni Net, RD, LDN, [facility] Inpatient Diabetes Coordinator [phone]

## 2024-02-02 NOTE — ED Provider Notes (Addendum)
 [place] EMERGENCY DEPARTMENT AT [facility] Provider Note   CSN: [id number] Arrival date & time: 02/02/24  0901     History  Chief Complaint  Patient presents with   Hyperglycemia   Near Syncope    Tara Patel is a 68 y.o. female.  Pt with hx iddm, indicates got faint when standing today, lightheaded, and fell to ground, denies loc or hitting head. No headache. No neck or back pain. Denies pain or injury from fall. Indicates had been feeling generally weak lately. Indicates has not been taking insulin  in past few weeks. Blood sugar in 500s. Recent polyuria and polydipsia. No vomiting. No dysuria. No fever or chills.   The history is provided by the patient, medical records and the EMS personnel.       Home Medications Prior to Admission medications   Medication Sig Start Date End Date Taking? Authorizing Provider  ACCU-CHEK GUIDE test strip 1 each by Other route 3 (three) times daily. for testing 01/25/21   [provider]  amLODipine  (NORVASC ) 10 MG tablet Take 10 mg by mouth daily. 01/27/21   [provider]  atorvastatin  (LIPITOR) 20 MG tablet Take 20 mg by mouth daily. 01/03/21   [provider]  buPROPion  (WELLBUTRIN  XL) 150 MG 24 hr tablet Take 150 mg by mouth daily.    [provider]  busPIRone  (BUSPAR ) 15 MG tablet Take 15 mg by mouth daily.    [provider]  Calcium  Carbonate-Vitamin D  600-400 MG-UNIT tablet Take 2 tablets by mouth daily.    [provider]  HUMALOG KWIKPEN 100 UNIT/ML KwikPen Inject 15 Units into the skin as directed. If sugar is above 200 02/07/21   [provider]  insulin  glargine (LANTUS ) 100 UNIT/ML injection Inject 0.2 mLs (20 Units total) into the skin 2 (two) times daily. 02/22/21   Aslam, Sadia, MD  omeprazole (PRILOSEC) 40 MG capsule Take 40 mg by mouth 2 (two) times daily. 12/16/20   [provider]  Peppermint Oil (IBGARD) 90 MG CPCR Take 90 mg by mouth  daily.    [provider]  prazosin (MINIPRESS) 2 MG capsule Take 2 mg by mouth at bedtime.    [provider]  traZODone  (DESYREL ) 50 MG tablet Take 50 mg by mouth at bedtime.    [provider]  venlafaxine  (EFFEXOR ) 25 MG tablet Take 75 mg by mouth 2 (two) times daily.    [provider]  simvastatin  (ZOCOR ) 40 MG tablet Take 20 mg by mouth at bedtime.    07/23/19  [provider]      Allergies    Baclofen    Review of Systems   Review of Systems  Constitutional:  Negative for chills and fever.  HENT:  Negative for sore throat and trouble swallowing.   Respiratory:  Negative for cough and shortness of breath.   Cardiovascular:  Negative for chest pain, palpitations and leg swelling.  Gastrointestinal:  Negative for abdominal pain, blood in stool, diarrhea and vomiting.  Endocrine: Positive for polydipsia and polyuria.  Genitourinary:  Negative for dysuria and flank pain.  Musculoskeletal:  Negative for back pain and neck pain.  Skin:  Negative for rash.  Neurological:  Positive for light-headedness. Negative for speech difficulty, numbness and headaches.    Physical Exam Updated Vital Signs BP 136/68 (BP Location: Left Arm)   Pulse 90   Temp 98 F (36.7 C) (Oral)   Resp 18   SpO2 97%  Physical  Exam Vitals and nursing note reviewed.  Constitutional:      Appearance: Normal appearance. She is well-developed.  HENT:     Head: Atraumatic.     Nose: Nose normal.     Mouth/Throat:     Mouth: Mucous membranes are moist.     Pharynx: Oropharynx is clear.  Eyes:     General: No scleral icterus.    Conjunctiva/sclera: Conjunctivae normal.     Pupils: Pupils are equal, round, and reactive to light.  Neck:     Vascular: No carotid bruit.     Trachea: No tracheal deviation.  Cardiovascular:     Rate and Rhythm: Normal rate and regular rhythm.     Pulses: Normal pulses.     Heart sounds: Normal heart sounds. No murmur heard.     No friction rub. No gallop.  Pulmonary:     Effort: Pulmonary effort is normal. No respiratory distress.     Breath sounds: Normal breath sounds.  Abdominal:     General: Bowel sounds are normal. There is no distension.     Palpations: Abdomen is soft. There is no mass.     Tenderness: There is no abdominal tenderness. There is no guarding.  Genitourinary:    Comments: No cva tenderness.  Musculoskeletal:        General: No swelling.     Cervical back: Normal range of motion and neck supple. No rigidity or tenderness. No muscular tenderness.     Comments: CTLS spine, non tender, aligned, no step off. Good rom bil ext without pain or focal bony tenderness. No extremity swelling. Intact distal pulses bil.   Skin:    General: Skin is warm and dry.     Findings: No rash.  Neurological:     Mental Status: She is alert.     Comments: Alert, speech normal. Motor/sens grossly intact bil.   Psychiatric:        Mood and Affect: Mood normal.    ED Results / Procedures / Treatments   Labs (all labs ordered are listed, but only abnormal results are displayed) Results for orders placed or performed during the hospital encounter of 02/02/24  CBG monitoring, ED   Collection Time: 02/02/24  9:18 AM  Result Value Ref Range   Glucose-Capillary 549 (HH) 70 - 99 mg/dL   Comment 1 Notify RN   CBC   Collection Time: 02/02/24  9:29 AM  Result Value Ref Range   WBC 7.5 4.0 - 10.5 K/uL   RBC 4.29 3.87 - 5.11 MIL/uL   Hemoglobin 11.0 (L) 12.0 - 15.0 g/dL   HCT 29.5 (L) 62.1 - 30.8 %   MCV 80.9 80.0 - 100.0 fL   MCH 25.6 (L) 26.0 - 34.0 pg   MCHC 31.7 30.0 - 36.0 g/dL   RDW 65.7 84.6 - 96.2 %   Platelets 228 150 - 400 K/uL   nRBC 0.0 0.0 - 0.2 %  Hemoglobin A1c   Collection Time: 02/02/24  9:29 AM  Result Value Ref Range   Hgb A1c MFr Bld 14.3 (H) 4.8 - 5.6 %   Mean Plasma Glucose 363.71 mg/dL  Comprehensive metabolic panel with GFR   Collection Time: 02/02/24 10:40 AM  Result Value Ref  Range   Sodium 131 (L) 135 - 145 mmol/L   Potassium 4.7 3.5 - 5.1 mmol/L   Chloride 97 (L) 98 - 111 mmol/L   CO2 27 22 - 32 mmol/L   Glucose, Bld 600 (HH) 70 - 99  mg/dL   BUN 26 (H) 8 - 23 mg/dL   Creatinine, Ser 4.25 (H) 0.44 - 1.00 mg/dL   Calcium  9.3 8.9 - 10.3 mg/dL   Total Protein 6.8 6.5 - 8.1 g/dL   Albumin 3.3 (L) 3.5 - 5.0 g/dL   AST 22 15 - 41 U/L   ALT 22 0 - 44 U/L   Alkaline Phosphatase 152 (H) 38 - 126 U/L   Total Bilirubin 0.4 0.0 - 1.2 mg/dL   GFR, Estimated 39 (L) >60 mL/min   Anion gap 7 5 - 15  Urinalysis, Routine w reflex microscopic -Urine, Clean Catch   Collection Time: 02/02/24  1:11 PM  Result Value Ref Range   Color, Urine YELLOW YELLOW   APPearance CLOUDY (A) CLEAR   Specific Gravity, Urine 1.027 1.005 - 1.030   pH 5.0 5.0 - 8.0   Glucose, UA >=500 (A) NEGATIVE mg/dL   Hgb urine dipstick SMALL (A) NEGATIVE   Bilirubin Urine NEGATIVE NEGATIVE   Ketones, ur 5 (A) NEGATIVE mg/dL   Protein, ur NEGATIVE NEGATIVE mg/dL   Nitrite NEGATIVE NEGATIVE   Leukocytes,Ua LARGE (A) NEGATIVE  POC CBG, ED   Collection Time: 02/02/24  2:24 PM  Result Value Ref Range   Glucose-Capillary 480 (H) 70 - 99 mg/dL  POC CBG, ED   Collection Time: 02/02/24  3:48 PM  Result Value Ref Range   Glucose-Capillary 426 (H) 70 - 99 mg/dL      EKG EKG Interpretation Date/Time:  Wednesday February 02 2024 12:49:24 EDT Ventricular Rate:  74 PR Interval:  179 QRS Duration:  81 QT Interval:  408 QTC Calculation: 453 R Axis:   69  Text Interpretation: Sinus rhythm Atrial premature complex No significant change since last tracing Confirmed by Guadalupe Lee ([id number]) on 02/02/2024 2:15:17 PM  Radiology No results found.  Procedures Procedures    Medications Ordered in ED Medications  sodium chloride  0.9 % bolus 500 mL (has no administration in time range)  cefTRIAXone  (ROCEPHIN ) 1 g in sodium chloride  0.9 % 100 mL IVPB (has no administration in time range)  lactated ringers   bolus 1,000 mL (1,000 mLs Intravenous New Bag/Given 02/02/24 1113)  insulin  aspart (novoLOG ) injection 14 Units (14 Units Subcutaneous Given 02/02/24 1302)  lactated ringers  bolus 1,000 mL (1,000 mLs Intravenous New Bag/Given 02/02/24 1306)    ED Course/ Medical Decision Making/ A&P                                 Medical Decision Making Problems Addressed: Hyperglycemia: acute illness or injury with systemic symptoms that poses a threat to life or bodily functions Insulin  dependent type 2 diabetes mellitus (HCC): chronic illness or injury with exacerbation, progression, or side effects of treatment that poses a threat to life or bodily functions Non compliance w medication regimen: acute illness or injury  Amount and/or Complexity of Data Reviewed Independent Historian: EMS    Details: hx External Data Reviewed: notes. Labs: ordered. Decision-making details documented in ED Course. ECG/medicine tests: ordered and independent interpretation performed. Decision-making details documented in ED Course.  Risk Prescription drug management. Decision regarding hospitalization.   Iv ns. Continuous pulse ox and cardiac monitoring. Labs ordered/sent.   Differential diagnosis includes hyperglycemia, dehydration, syncope, anemia, etc. Dispo decision including potential need for admission considered - will get labs and reassess.   Reviewed nursing notes and prior charts for additional history. External reports reviewed. Additional history from:  EMS.   Glucose v high. LR bolus.   Cardiac monitor: sinus rhythm, rate 70.  Labs reviewed/interpreted by me - wbc normal, hct 35. Glucose v high. Hco3 normal, ag not elevated.   Additional ivf. Novolog  sq.   Additional labs reviewed/interpreted by me - ua w large le, no micro report, ?possible uti, rocephin  iv. RN to contact lab to complete reporting on UA.   Repeat CBG, improved 480, receiving ivf.   Pt indicates she does have adequate of her  meds, including insulin , at home. Discussed importance of being compliant w meds. Pt feels improved.  Denies current pain or other c/o. Chest cta bil. Abd soft non tender. Po fluids/food.   1550, ua/micro pending. Repeat cbgs and po trial pending. Signed out  to Dr Nora Beal to check pending labs, tx uti if pos, recheck pt/cbgs, and dispo appropriately.           Final Clinical Impression(s) / ED Diagnoses Final diagnoses:  Hyperglycemia  Insulin  dependent type 2 diabetes mellitus (HCC)  Non compliance w medication regimen    Rx / DC Orders ED Discharge Orders     None          Guadalupe Lee, MD 02/02/24 [phone]

## 2024-02-02 NOTE — ED Provider Notes (Signed)
 Patient signed out to me at 1600 by Dr. Moses Arenas pending UA, repeat glucose and reassessment. In short, this is a 68 year old female with a past medical history of insulin -dependent diabetes that presented to the emergency department with dizziness and high blood sugar.  Patient had workup performed by previous provider that showed hyperglycemia without DKA and mildly elevated creatinine.  She was given fluids and insulin .  Patient has been able to tolerate p.o. and reports improvement of her symptoms.  Will have repeat glucose checked.  Patient does have large leuks in her urine but has no bacteria and multiple squames making UTI unlikely.  6:12 PM Repeat glucose continuing to downtrend. Patient reports she feels well. She is stable for discharge home with outpatient follow up.   Kingsley, Chiyo Fay K, DO 02/02/24 [phone]

## 2024-02-02 NOTE — ED Notes (Signed)
 Provided pt with [place] sandwich and diet ginger ale

## 2024-02-02 NOTE — ED Triage Notes (Signed)
 Pt BIBA for fall around 802 today.   Pt denies LOC, hitting head or blood thinner use  Pt has hx of DM with cbg 549

## 2024-02-03 ENCOUNTER — Telehealth (HOSPITAL_COMMUNITY): Payer: Self-pay | Admitting: Emergency Medicine

## 2024-02-03 MED ORDER — INSULIN ASPART 100 UNIT/ML FLEXPEN: 15.0000 [IU] | PEN_INJECTOR | Freq: Three times a day (TID) | SUBCUTANEOUS | 0 refills | Status: DC

## 2024-02-03 NOTE — Telephone Encounter (Cosign Needed)
 Called and insulin  aspart FlexPen per pharmacy preference over Humalog  KwikPen from recent presentation for hyperglycemia

## 2024-02-11 ENCOUNTER — Emergency Department (HOSPITAL_COMMUNITY)
Admission: EM | Admit: 2024-02-11 | Discharge: 2024-02-11 | Disposition: A | Discharge status: Home / Self Care | Attending: Emergency Medicine | Admitting: Emergency Medicine

## 2024-02-11 ENCOUNTER — Encounter (HOSPITAL_COMMUNITY): Payer: Self-pay | Admitting: *Deleted

## 2024-02-11 ENCOUNTER — Emergency Department (HOSPITAL_COMMUNITY)

## 2024-02-11 ENCOUNTER — Other Ambulatory Visit: Payer: Self-pay

## 2024-02-11 DIAGNOSIS — E1122 Type 2 diabetes mellitus with diabetic chronic kidney disease: Secondary | ICD-10-CM | POA: Diagnosis not present

## 2024-02-11 DIAGNOSIS — I10 Essential (primary) hypertension: Secondary | ICD-10-CM

## 2024-02-11 DIAGNOSIS — N183 Chronic kidney disease, stage 3 unspecified: Secondary | ICD-10-CM | POA: Insufficient documentation

## 2024-02-11 DIAGNOSIS — R739 Hyperglycemia, unspecified: Secondary | ICD-10-CM

## 2024-02-11 DIAGNOSIS — I129 Hypertensive chronic kidney disease with stage 1 through stage 4 chronic kidney disease, or unspecified chronic kidney disease: Secondary | ICD-10-CM | POA: Diagnosis not present

## 2024-02-11 DIAGNOSIS — Z79899 Other long term (current) drug therapy: Secondary | ICD-10-CM | POA: Insufficient documentation

## 2024-02-11 DIAGNOSIS — Z794 Long term (current) use of insulin: Secondary | ICD-10-CM | POA: Insufficient documentation

## 2024-02-11 DIAGNOSIS — S92511A Displaced fracture of proximal phalanx of right lesser toe(s), initial encounter for closed fracture: Secondary | ICD-10-CM | POA: Diagnosis not present

## 2024-02-11 DIAGNOSIS — E669 Obesity, unspecified: Secondary | ICD-10-CM | POA: Insufficient documentation

## 2024-02-11 DIAGNOSIS — J45909 Unspecified asthma, uncomplicated: Secondary | ICD-10-CM | POA: Insufficient documentation

## 2024-02-11 DIAGNOSIS — E871 Hypo-osmolality and hyponatremia: Secondary | ICD-10-CM | POA: Diagnosis not present

## 2024-02-11 DIAGNOSIS — I2510 Atherosclerotic heart disease of native coronary artery without angina pectoris: Secondary | ICD-10-CM | POA: Diagnosis not present

## 2024-02-11 DIAGNOSIS — X58XXXA Exposure to other specified factors, initial encounter: Secondary | ICD-10-CM | POA: Insufficient documentation

## 2024-02-11 DIAGNOSIS — S92911A Unspecified fracture of right toe(s), initial encounter for closed fracture: Secondary | ICD-10-CM

## 2024-02-11 DIAGNOSIS — E1165 Type 2 diabetes mellitus with hyperglycemia: Secondary | ICD-10-CM | POA: Insufficient documentation

## 2024-02-11 LAB — CBC
HCT: 36.3 % (ref 36.0–46.0)
Hemoglobin: 11.6 g/dL — ABNORMAL LOW (ref 12.0–15.0)
MCH: 26 pg (ref 26.0–34.0)
MCHC: 32 g/dL (ref 30.0–36.0)
MCV: 81.4 fL (ref 80.0–100.0)
Platelets: 280 10*3/uL (ref 150–400)
RBC: 4.46 MIL/uL (ref 3.87–5.11)
RDW: 12.6 % (ref 11.5–15.5)
WBC: 5.7 10*3/uL (ref 4.0–10.5)
nRBC: 0 % (ref 0.0–0.2)

## 2024-02-11 LAB — BASIC METABOLIC PANEL WITH GFR
Anion gap: 11 (ref 5–15)
BUN: 19 mg/dL (ref 8–23)
CO2: 23 mmol/L (ref 22–32)
Calcium: 8.6 mg/dL — ABNORMAL LOW (ref 8.9–10.3)
Chloride: 98 mmol/L (ref 98–111)
Creatinine, Ser: 1.06 mg/dL — ABNORMAL HIGH (ref 0.44–1.00)
GFR, Estimated: 57 mL/min — ABNORMAL LOW (ref >60)
Glucose, Bld: 464 mg/dL — ABNORMAL HIGH (ref 70–99)
Potassium: 4 mmol/L (ref 3.5–5.1)
Sodium: 132 mmol/L — ABNORMAL LOW (ref 135–145)

## 2024-02-11 LAB — TROPONIN I (HIGH SENSITIVITY)
Troponin I (High Sensitivity): 4 ng/L (ref <18)
Troponin I (High Sensitivity): 6 ng/L (ref <18)

## 2024-02-11 LAB — CBG MONITORING, ED
Glucose-Capillary: 458 mg/dL — ABNORMAL HIGH (ref 70–99)
Glucose-Capillary: 262 mg/dL — ABNORMAL HIGH (ref 70–99)

## 2024-02-11 MED ORDER — ACETAMINOPHEN 500 MG PO TABS
1000.0000 mg | ORAL_TABLET | Freq: Once | ORAL | Status: AC
  Administered 2024-02-11: 1000 mg via ORAL
  Filled 2024-02-11: qty 2

## 2024-02-11 MED ORDER — SODIUM CHLORIDE 0.9 % IV BOLUS
1000.0000 mL | Freq: Once | INTRAVENOUS | Status: AC
  Administered 2024-02-11: 1000 mL via INTRAVENOUS

## 2024-02-11 MED ORDER — HYDRALAZINE HCL 20 MG/ML IJ SOLN
10.0000 mg | Freq: Once | INTRAMUSCULAR | Status: AC
  Administered 2024-02-11: 10 mg via INTRAVENOUS
  Filled 2024-02-11: qty 1

## 2024-02-11 NOTE — ED Provider Notes (Signed)
  EMERGENCY DEPARTMENT AT [facility] Provider Note   CSN: [id number] Arrival date & time: 02/11/24  1554     History  Chief Complaint  Patient presents with   Hypertension    Tara Patel is a 68 y.o. female.  68 year old female presents today for concern of elevated blood pressure, elevated blood sugar from the [place] clinic as well as pain to her 4th and 5th toe on the right foot.  Patient states she feels great and does not feel that she needs to be here.  Denies chest pain, shortness of breath, balance issues, vision change.  The history is provided by the patient. No language interpreter was used.       Home Medications Prior to Admission medications   Medication Sig Start Date End Date Taking? Authorizing Provider  amLODipine  (NORVASC ) 10 MG tablet Take 10 mg by mouth daily. 01/27/21   [provider]  atorvastatin  (LIPITOR) 20 MG tablet Take 20 mg by mouth daily. 01/03/21   [provider]  buPROPion  (WELLBUTRIN  XL) 150 MG 24 hr tablet Take 150 mg by mouth daily.    [provider]  busPIRone  (BUSPAR ) 15 MG tablet Take 15 mg by mouth daily.    [provider]  Calcium  Carbonate-Vitamin D  600-400 MG-UNIT tablet Take 2 tablets by mouth daily.    [provider]  glucose blood (ACCU-CHEK GUIDE) test strip 1 each by Other route 3 (three) times daily. for testing 02/02/24   Kingsley, Victoria K, DO  HUMALOG  KWIKPEN 100 UNIT/ML KwikPen Inject 15 Units into the skin as directed. If sugar is above 200 02/02/24   Kingsley, Victoria K, DO  insulin  aspart (NOVOLOG ) 100 UNIT/ML FlexPen Inject 15 Units into the skin 3 (three) times daily with meals. 02/03/24   Felicie Horning, PA-C  insulin  glargine (LANTUS ) 100 UNIT/ML injection Inject 0.2 mLs (20 Units total) into the skin 2 (two) times daily. 02/02/24   Kingsley, Victoria K, DO  omeprazole (PRILOSEC) 40 MG capsule Take 40 mg by mouth 2 (two) times daily. 12/16/20   [provider]  Peppermint Oil (IBGARD) 90 MG CPCR Take 90 mg by mouth daily.    [provider]  prazosin (MINIPRESS) 2 MG capsule Take 2 mg by mouth at bedtime.    [provider]  traZODone  (DESYREL ) 50 MG tablet Take 50 mg by mouth at bedtime.    [provider]  venlafaxine  (EFFEXOR ) 25 MG tablet Take 75 mg by mouth 2 (two) times daily.    [provider]  simvastatin  (ZOCOR ) 40 MG tablet Take 20 mg by mouth at bedtime.    07/23/19  [provider]      Allergies    Baclofen and Metformin    Review of Systems   Review of Systems  Constitutional:  Negative for chills and fever.  Eyes:  Negative for visual disturbance.  Respiratory:  Negative for shortness of breath.   Cardiovascular:  Negative for chest pain.  Neurological:  Negative for dizziness and headaches.  All other systems reviewed and are negative.   Physical Exam Updated Vital Signs BP (!) 217/88   Pulse 68   Temp 98 F (36.7 C) (Oral)   Resp 18   Ht 5\' 5"  (1.651 m)   Wt 79 kg   SpO2 100%   BMI 28.98 kg/m  Physical Exam Vitals and nursing note reviewed.  Constitutional:      General: She is not in acute distress.  Appearance: Normal appearance. She is not ill-appearing.  HENT:     Head: Normocephalic and atraumatic.     Nose: Nose normal.  Eyes:     Conjunctiva/sclera: Conjunctivae normal.  Cardiovascular:     Rate and Rhythm: Normal rate and regular rhythm.  Pulmonary:     Effort: Pulmonary effort is normal. No respiratory distress.  Musculoskeletal:        General: No deformity. Normal range of motion.     Comments: No range of motion in bilateral lower extremities.  Neurovascularly intact.  Mild tenderness over the 4th and 5th digit.  Skin:    Findings: No rash.  Neurological:     Mental Status: She is alert.     ED Results / Procedures / Treatments   Labs (all labs ordered are listed, but only abnormal results are displayed) Labs Reviewed   BASIC METABOLIC PANEL WITH GFR - Abnormal; Notable for the following components:      Result Value   Sodium 132 (*)    Glucose, Bld 464 (*)    Creatinine, Ser 1.06 (*)    Calcium  8.6 (*)    GFR, Estimated 57 (*)    All other components within normal limits  CBC - Abnormal; Notable for the following components:   Hemoglobin 11.6 (*)    All other components within normal limits  CBG MONITORING, ED - Abnormal; Notable for the following components:   Glucose-Capillary 458 (*)    All other components within normal limits  CBG MONITORING, ED - Abnormal; Notable for the following components:   Glucose-Capillary 262 (*)    All other components within normal limits  TROPONIN I (HIGH SENSITIVITY)  TROPONIN I (HIGH SENSITIVITY)    EKG None  Radiology DG Foot Complete Right Result Date: 02/11/2024 CLINICAL DATA:  Pain to the 3 lateral toes.  Right foot pain. EXAM: RIGHT FOOT COMPLETE - 3+ VIEW COMPARISON:  None Available. FINDINGS: Minimally displaced fractures involving the fourth and fifth proximal phalanges. No convincing intra-articular involvement. Slight hammertoe deformity of the digits. No erosive or bony destructive change. There is a plantar calcaneal spur and Achilles tendon enthesophyte. Generalized subcutaneous edema. IMPRESSION: Minimally displaced fractures involving the fourth and fifth proximal phalanges. Electronically Signed   By: Chadwick Colonel M.D.   On: 02/11/2024 19:17   DG Chest 2 View Result Date: 02/11/2024 CLINICAL DATA:  high bp glucose high. EXAM: CHEST - 2 VIEW COMPARISON:  02/20/2021. FINDINGS: Bilateral lung fields are clear. Bilateral costophrenic angles are clear. Normal cardio-mediastinal silhouette. No acute osseous abnormalities. The soft tissues are within normal limits. IMPRESSION: *No active cardiopulmonary disease. Electronically Signed   By: Beula Brunswick M.D.   On: 02/11/2024 17:06    Procedures Procedures    Medications Ordered in ED Medications   sodium chloride  0.9 % bolus 1,000 mL (1,000 mLs Intravenous New Bag/Given 02/11/24 1955)  acetaminophen  (TYLENOL ) tablet 1,000 mg (1,000 mg Oral Given 02/11/24 2003)  hydrALAZINE (APRESOLINE) injection 10 mg (10 mg Intravenous Given 02/11/24 2003)    ED Course/ Medical Decision Making/ A&P                                 Medical Decision Making Amount and/or Complexity of Data Reviewed Radiology: ordered.  Risk OTC drugs. Prescription drug management.   Medical Decision Making / ED Course   This patient presents to the ED for concern of elevated blood pressure, elevated blood sugar,  toe pain, this involves an extensive number of treatment options, and is a complaint that carries with it a high risk of complications and morbidity.  The differential diagnosis includes hypertensive emergency, hypertensive urgency, hyperglycemia, DKA, HHS, fracture, contusion  MDM: 68 year old female presents today for concern of above-mentioned symptoms.  Hemodynamically stable and without any complaints.  Compliant with her home medicines.  CBC without leukocytosis.  Hemoglobin at her baseline.   BMP with creatinine of 1.06, glucose 464.  Foot x-ray shows minimally displaced fractures of the 4th and 5th digit otherwise no acute finding.  Will provide her postop shoe and buddy tape the affected toes.  Troponin negative.  EKG without acute ischemic change.  Chest x-ray without acute cardiopulmonary process.  Low suspicion for ACS.   Additional history obtained: -Additional history obtained from [place] clinic note -External records from outside source obtained and reviewed including: Chart review including previous notes, labs, imaging, consultation notes   Lab Tests: -I ordered, reviewed, and interpreted labs.   The pertinent results include:   Labs Reviewed  BASIC METABOLIC PANEL WITH GFR - Abnormal; Notable for the following components:      Result Value   Sodium 132 (*)    Glucose, Bld 464  (*)    Creatinine, Ser 1.06 (*)    Calcium  8.6 (*)    GFR, Estimated 57 (*)    All other components within normal limits  CBC - Abnormal; Notable for the following components:   Hemoglobin 11.6 (*)    All other components within normal limits  CBG MONITORING, ED - Abnormal; Notable for the following components:   Glucose-Capillary 458 (*)    All other components within normal limits  CBG MONITORING, ED - Abnormal; Notable for the following components:   Glucose-Capillary 262 (*)    All other components within normal limits  TROPONIN I (HIGH SENSITIVITY)  TROPONIN I (HIGH SENSITIVITY)      EKG  EKG Interpretation Date/Time:    Ventricular Rate:    PR Interval:    QRS Duration:    QT Interval:    QTC Calculation:   R Axis:      Text Interpretation:           Imaging Studies ordered: I ordered imaging studies including right foot x-ray, chest x-ray I independently visualized and interpreted imaging. I agree with the radiologist interpretation   Medicines ordered and prescription drug management: Meds ordered this encounter  Medications   sodium chloride  0.9 % bolus 1,000 mL   acetaminophen  (TYLENOL ) tablet 1,000 mg   hydrALAZINE (APRESOLINE) injection 10 mg    -I have reviewed the patients home medicines and have made adjustments as needed  Cardiac Monitoring: The patient was maintained on a cardiac monitor.  I personally viewed and interpreted the cardiac monitored which showed an underlying rhythm of: Normal sinus rhythm  Social Determinants of Health:  Factors impacting patients care include: Good outpatient follow-up   Reevaluation: After the interventions noted above, I reevaluated the patient and found that they have :improved  Co morbidities that complicate the patient evaluation  Past Medical History:  Diagnosis Date   Asthma    Chest pain    Mild nonobstructive CAD (by cath Aug '10)   CKD (chronic kidney disease) stage 3, GFR 30-59 ml/min  (HCC)    Coronary artery disease    Mild, nonobstructive by catheterization 05/23/2009   Depression    Diabetes mellitus    GERD (gastroesophageal reflux disease)    Hypercholesteremia  Hypertension    Normocytic anemia 10/19/2013   Obesity    OSA (obstructive sleep apnea)    CPAP Q HS      Dispostion: Discharged in stable condition.  Return precaution discussed.  Patient voices understanding and is in agreement with plan.   Final Clinical Impression(s) / ED Diagnoses Final diagnoses:  Uncontrolled hypertension  Hyperglycemia  Closed displaced fracture of phalanx of toe of right foot, unspecified toe, initial encounter    Rx / DC Orders ED Discharge Orders     None         Lucina Sabal, PA-C 02/11/24 2045    Almond Army, MD 02/12/24 0020

## 2024-02-11 NOTE — ED Notes (Signed)
 The pa has been notified of the pt being  a level 2

## 2024-02-11 NOTE — ED Triage Notes (Signed)
 [facility] [place] she was told to come here with high bp  and high sugar  she has had both these for 3-4 weeks

## 2024-02-11 NOTE — ED Triage Notes (Signed)
 Pt c/o a painful rt 4th toe  she has had for a week ago  she struck it on the bed post at home

## 2024-02-11 NOTE — Discharge Instructions (Signed)
 Your workup was reassuring.  No evidence of heart attack or other concerning findings from elevated blood pressure.  Blood sugar improved with fluids and monitoring.  No evidence of concerning findings proceed with elevated blood sugar.  Continue taking your home insulin .  Follow-up with your primary care doctor.  You did have 2 fractured toes on the right foot your pinky toe and the toe beside it.  You were placed in the buddy tape and given a postop shoe.  I have listed an orthopedist you can follow-up with.  Return for any emergent symptoms.

## 2024-07-06 ENCOUNTER — Inpatient Hospital Stay (HOSPITAL_COMMUNITY)
Admission: EM | Admit: 2024-07-06 | Discharge: 2024-07-08 | DRG: 871 | Disposition: A | Discharge status: Home / Self Care | Attending: Family Medicine | Admitting: Family Medicine

## 2024-07-06 ENCOUNTER — Other Ambulatory Visit: Payer: Self-pay

## 2024-07-06 ENCOUNTER — Encounter (HOSPITAL_COMMUNITY): Payer: Self-pay

## 2024-07-06 ENCOUNTER — Emergency Department (HOSPITAL_COMMUNITY)

## 2024-07-06 DIAGNOSIS — Z8249 Family history of ischemic heart disease and other diseases of the circulatory system: Secondary | ICD-10-CM

## 2024-07-06 DIAGNOSIS — Z6833 Body mass index (BMI) 33.0-33.9, adult: Secondary | ICD-10-CM

## 2024-07-06 DIAGNOSIS — Z79899 Other long term (current) drug therapy: Secondary | ICD-10-CM

## 2024-07-06 DIAGNOSIS — Z23 Encounter for immunization: Secondary | ICD-10-CM

## 2024-07-06 DIAGNOSIS — Z794 Long term (current) use of insulin: Secondary | ICD-10-CM | POA: Diagnosis not present

## 2024-07-06 DIAGNOSIS — G4733 Obstructive sleep apnea (adult) (pediatric): Secondary | ICD-10-CM | POA: Diagnosis present

## 2024-07-06 DIAGNOSIS — E119 Type 2 diabetes mellitus without complications: Secondary | ICD-10-CM

## 2024-07-06 DIAGNOSIS — Z7409 Other reduced mobility: Secondary | ICD-10-CM | POA: Diagnosis present

## 2024-07-06 DIAGNOSIS — I2510 Atherosclerotic heart disease of native coronary artery without angina pectoris: Secondary | ICD-10-CM | POA: Diagnosis present

## 2024-07-06 DIAGNOSIS — E872 Acidosis, unspecified: Secondary | ICD-10-CM | POA: Diagnosis present

## 2024-07-06 DIAGNOSIS — N1831 Chronic kidney disease, stage 3a: Secondary | ICD-10-CM

## 2024-07-06 DIAGNOSIS — K219 Gastro-esophageal reflux disease without esophagitis: Secondary | ICD-10-CM | POA: Diagnosis present

## 2024-07-06 DIAGNOSIS — N183 Chronic kidney disease, stage 3 unspecified: Secondary | ICD-10-CM | POA: Diagnosis present

## 2024-07-06 DIAGNOSIS — N179 Acute kidney failure, unspecified: Secondary | ICD-10-CM

## 2024-07-06 DIAGNOSIS — E785 Hyperlipidemia, unspecified: Secondary | ICD-10-CM

## 2024-07-06 DIAGNOSIS — Z888 Allergy status to other drugs, medicaments and biological substances status: Secondary | ICD-10-CM

## 2024-07-06 DIAGNOSIS — Z833 Family history of diabetes mellitus: Secondary | ICD-10-CM

## 2024-07-06 DIAGNOSIS — E66811 Obesity, class 1: Secondary | ICD-10-CM

## 2024-07-06 DIAGNOSIS — I160 Hypertensive urgency: Secondary | ICD-10-CM | POA: Diagnosis present

## 2024-07-06 DIAGNOSIS — A419 Sepsis, unspecified organism: Principal | ICD-10-CM

## 2024-07-06 DIAGNOSIS — E1165 Type 2 diabetes mellitus with hyperglycemia: Secondary | ICD-10-CM | POA: Diagnosis present

## 2024-07-06 DIAGNOSIS — N1832 Chronic kidney disease, stage 3b: Secondary | ICD-10-CM | POA: Diagnosis present

## 2024-07-06 DIAGNOSIS — J470 Bronchiectasis with acute lower respiratory infection: Secondary | ICD-10-CM | POA: Diagnosis present

## 2024-07-06 DIAGNOSIS — J189 Pneumonia, unspecified organism: Secondary | ICD-10-CM | POA: Diagnosis present

## 2024-07-06 DIAGNOSIS — Z90710 Acquired absence of both cervix and uterus: Secondary | ICD-10-CM

## 2024-07-06 DIAGNOSIS — E1122 Type 2 diabetes mellitus with diabetic chronic kidney disease: Secondary | ICD-10-CM | POA: Diagnosis present

## 2024-07-06 DIAGNOSIS — R911 Solitary pulmonary nodule: Secondary | ICD-10-CM | POA: Diagnosis not present

## 2024-07-06 DIAGNOSIS — D631 Anemia in chronic kidney disease: Secondary | ICD-10-CM | POA: Diagnosis present

## 2024-07-06 DIAGNOSIS — F32A Depression, unspecified: Secondary | ICD-10-CM

## 2024-07-06 DIAGNOSIS — Z96659 Presence of unspecified artificial knee joint: Secondary | ICD-10-CM | POA: Diagnosis present

## 2024-07-06 DIAGNOSIS — Z1152 Encounter for screening for COVID-19: Secondary | ICD-10-CM | POA: Diagnosis not present

## 2024-07-06 DIAGNOSIS — I129 Hypertensive chronic kidney disease with stage 1 through stage 4 chronic kidney disease, or unspecified chronic kidney disease: Secondary | ICD-10-CM | POA: Diagnosis present

## 2024-07-06 DIAGNOSIS — E7800 Pure hypercholesterolemia, unspecified: Secondary | ICD-10-CM | POA: Diagnosis present

## 2024-07-06 DIAGNOSIS — E11649 Type 2 diabetes mellitus with hypoglycemia without coma: Secondary | ICD-10-CM | POA: Diagnosis not present

## 2024-07-06 LAB — COMPREHENSIVE METABOLIC PANEL WITH GFR
ALT: 28 U/L (ref 0–44)
AST: 44 U/L — ABNORMAL HIGH (ref 15–41)
Albumin: 3.3 g/dL — ABNORMAL LOW (ref 3.5–5.0)
Alkaline Phosphatase: 210 U/L — ABNORMAL HIGH (ref 38–126)
Anion gap: 14 (ref 5–15)
BUN: 10 mg/dL (ref 8–23)
CO2: 27 mmol/L (ref 22–32)
Calcium: 8.6 mg/dL — ABNORMAL LOW (ref 8.9–10.3)
Chloride: 96 mmol/L — ABNORMAL LOW (ref 98–111)
Creatinine, Ser: 1.19 mg/dL — ABNORMAL HIGH (ref 0.44–1.00)
GFR, Estimated: 49 mL/min — ABNORMAL LOW (ref >60)
Glucose, Bld: 354 mg/dL — ABNORMAL HIGH (ref 70–99)
Potassium: 3 mmol/L — ABNORMAL LOW (ref 3.5–5.1)
Sodium: 137 mmol/L (ref 135–145)
Total Bilirubin: 1 mg/dL (ref 0.0–1.2)
Total Protein: 7.2 g/dL (ref 6.5–8.1)

## 2024-07-06 LAB — STREP PNEUMONIAE URINARY ANTIGEN: Strep Pneumo Urinary Antigen: NEGATIVE

## 2024-07-06 LAB — RESPIRATORY PANEL BY PCR

## 2024-07-06 LAB — URINALYSIS, W/ REFLEX TO CULTURE (INFECTION SUSPECTED)
Bilirubin Urine: NEGATIVE
Glucose, UA: >=500 mg/dL — AB
Ketones, ur: 20 mg/dL — AB
Leukocytes,Ua: NEGATIVE
Nitrite: NEGATIVE
Protein, ur: >=300 mg/dL — AB
Specific Gravity, Urine: 1.011 (ref 1.005–1.030)
pH: 7 (ref 5.0–8.0)

## 2024-07-06 LAB — CBG MONITORING, ED
Glucose-Capillary: 308 mg/dL — ABNORMAL HIGH (ref 70–99)
Glucose-Capillary: 247 mg/dL — ABNORMAL HIGH (ref 70–99)

## 2024-07-06 LAB — CBC WITH DIFFERENTIAL/PLATELET
Abs Immature Granulocytes: 0.05 K/uL (ref 0.00–0.07)
Basophils Absolute: 0 K/uL (ref 0.0–0.1)
Basophils Relative: 0 %
Eosinophils Absolute: 0 K/uL (ref 0.0–0.5)
Eosinophils Relative: 0 %
HCT: 37.3 % (ref 36.0–46.0)
Hemoglobin: 11.6 g/dL — ABNORMAL LOW (ref 12.0–15.0)
Immature Granulocytes: 0 %
Lymphocytes Relative: 4 %
Lymphs Abs: 0.5 K/uL — ABNORMAL LOW (ref 0.7–4.0)
MCH: 25.3 pg — ABNORMAL LOW (ref 26.0–34.0)
MCHC: 31.1 g/dL (ref 30.0–36.0)
MCV: 81.3 fL (ref 80.0–100.0)
Monocytes Absolute: 0.4 K/uL (ref 0.1–1.0)
Monocytes Relative: 4 %
Neutro Abs: 11 K/uL — ABNORMAL HIGH (ref 1.7–7.7)
Neutrophils Relative %: 92 %
Platelets: 211 K/uL (ref 150–400)
RBC: 4.59 MIL/uL (ref 3.87–5.11)
RDW: 12.9 % (ref 11.5–15.5)
WBC: 12.1 K/uL — ABNORMAL HIGH (ref 4.0–10.5)
nRBC: 0 % (ref 0.0–0.2)

## 2024-07-06 LAB — HIV ANTIBODY (ROUTINE TESTING W REFLEX): HIV Screen 4th Generation wRfx: NONREACTIVE

## 2024-07-06 LAB — TROPONIN I (HIGH SENSITIVITY)
Troponin I (High Sensitivity): 41 ng/L — ABNORMAL HIGH (ref <18)
Troponin I (High Sensitivity): 29 ng/L — ABNORMAL HIGH (ref <18)

## 2024-07-06 LAB — RESP PANEL BY RT-PCR (RSV, FLU A&B, COVID)  RVPGX2
Influenza A by PCR: NEGATIVE
Influenza B by PCR: NEGATIVE
Resp Syncytial Virus by PCR: NEGATIVE
SARS Coronavirus 2 by RT PCR: NEGATIVE

## 2024-07-06 LAB — I-STAT CG4 LACTIC ACID, ED
Lactic Acid, Venous: 3 mmol/L (ref 0.5–1.9)
Lactic Acid, Venous: 1.6 mmol/L (ref 0.5–1.9)

## 2024-07-06 LAB — GLUCOSE, CAPILLARY
Glucose-Capillary: 119 mg/dL — ABNORMAL HIGH (ref 70–99)
Glucose-Capillary: 190 mg/dL — ABNORMAL HIGH (ref 70–99)

## 2024-07-06 LAB — PROCALCITONIN: Procalcitonin: 25.82 ng/mL

## 2024-07-06 LAB — HEMOGLOBIN A1C
Hgb A1c MFr Bld: 10 % — ABNORMAL HIGH (ref 4.8–5.6)
Mean Plasma Glucose: 240.3 mg/dL

## 2024-07-06 LAB — PROTIME-INR
INR: 1.1 (ref 0.8–1.2)
Prothrombin Time: 14.3 s (ref 11.4–15.2)

## 2024-07-06 MED ORDER — ENOXAPARIN SODIUM 40 MG/0.4ML IJ SOSY
40.0000 mg | PREFILLED_SYRINGE | INTRAMUSCULAR | Status: DC
Start: 2024-07-06 — End: 2024-07-08
  Administered 2024-07-06 – 2024-07-08 (×3): 40 mg via SUBCUTANEOUS
  Filled 2024-07-06 (×3): qty 0.4

## 2024-07-06 MED ORDER — ASPIRIN 81 MG PO TBEC
81.0000 mg | DELAYED_RELEASE_TABLET | Freq: Every day | ORAL | Status: DC
  Administered 2024-07-06 – 2024-07-08 (×3): 81 mg via ORAL
  Filled 2024-07-06 (×3): qty 1

## 2024-07-06 MED ORDER — LACTATED RINGERS IV BOLUS
1000.0000 mL | Freq: Once | INTRAVENOUS | Status: AC
  Administered 2024-07-06: 1000 mL via INTRAVENOUS

## 2024-07-06 MED ORDER — SODIUM CHLORIDE 0.9 % IV SOLN
500.0000 mg | Freq: Once | INTRAVENOUS | Status: AC
  Administered 2024-07-06: 500 mg via INTRAVENOUS
  Filled 2024-07-06: qty 5

## 2024-07-06 MED ORDER — LOSARTAN POTASSIUM 50 MG PO TABS
100.0000 mg | ORAL_TABLET | Freq: Every day | ORAL | Status: DC
  Administered 2024-07-06 – 2024-07-08 (×3): 100 mg via ORAL
  Filled 2024-07-06 (×3): qty 2

## 2024-07-06 MED ORDER — INSULIN ASPART 100 UNIT/ML IJ SOLN: 0.0000 [IU] | Freq: Every day | INTRAMUSCULAR | Status: DC

## 2024-07-06 MED ORDER — METHOCARBAMOL 500 MG PO TABS
500.0000 mg | ORAL_TABLET | Freq: Four times a day (QID) | ORAL | Status: DC | PRN
  Administered 2024-07-06: 500 mg via ORAL
  Filled 2024-07-06: qty 1

## 2024-07-06 MED ORDER — VENLAFAXINE HCL ER 75 MG PO CP24
150.0000 mg | ORAL_CAPSULE | Freq: Every day | ORAL | Status: DC
  Administered 2024-07-06 – 2024-07-08 (×3): 150 mg via ORAL
  Filled 2024-07-06 (×3): qty 2

## 2024-07-06 MED ORDER — INSULIN ASPART 100 UNIT/ML IJ SOLN
0.0000 [IU] | Freq: Three times a day (TID) | INTRAMUSCULAR | Status: DC
  Administered 2024-07-06: 15 [IU] via SUBCUTANEOUS
  Administered 2024-07-06 – 2024-07-07 (×2): 7 [IU] via SUBCUTANEOUS
  Administered 2024-07-07: 4 [IU] via SUBCUTANEOUS
  Administered 2024-07-08: 7 [IU] via SUBCUTANEOUS

## 2024-07-06 MED ORDER — ACETAMINOPHEN 500 MG PO TABS
1000.0000 mg | ORAL_TABLET | Freq: Once | ORAL | Status: AC
  Administered 2024-07-06: 1000 mg via ORAL
  Filled 2024-07-06: qty 2

## 2024-07-06 MED ORDER — MIRTAZAPINE 15 MG PO TABS
15.0000 mg | ORAL_TABLET | Freq: Every day | ORAL | Status: DC
  Administered 2024-07-06 – 2024-07-07 (×2): 15 mg via ORAL
  Filled 2024-07-06 (×2): qty 1

## 2024-07-06 MED ORDER — INSULIN GLARGINE-YFGN 100 UNIT/ML ~~LOC~~ SOLN: 25.0000 [IU] | Freq: Two times a day (BID) | SUBCUTANEOUS | Status: DC

## 2024-07-06 MED ORDER — MAGNESIUM OXIDE -MG SUPPLEMENT 400 (240 MG) MG PO TABS
400.0000 mg | ORAL_TABLET | Freq: Every day | ORAL | Status: DC
Start: 2024-07-06 — End: 2024-07-08
  Administered 2024-07-06 – 2024-07-08 (×3): 400 mg via ORAL
  Filled 2024-07-06 (×3): qty 1

## 2024-07-06 MED ORDER — POTASSIUM CHLORIDE CRYS ER 20 MEQ PO TBCR
40.0000 meq | EXTENDED_RELEASE_TABLET | Freq: Once | ORAL | Status: AC
  Administered 2024-07-06: 40 meq via ORAL
  Filled 2024-07-06: qty 2

## 2024-07-06 MED ORDER — GUAIFENESIN ER 600 MG PO TB12
600.0000 mg | ORAL_TABLET | Freq: Two times a day (BID) | ORAL | Status: DC
Start: 2024-07-06 — End: 2024-07-08
  Administered 2024-07-06 – 2024-07-08 (×5): 600 mg via ORAL
  Filled 2024-07-06 (×5): qty 1

## 2024-07-06 MED ORDER — SODIUM CHLORIDE 0.9% FLUSH
3.0000 mL | Freq: Two times a day (BID) | INTRAVENOUS | Status: DC
  Administered 2024-07-06 – 2024-07-08 (×5): 3 mL via INTRAVENOUS

## 2024-07-06 MED ORDER — INSULIN GLARGINE 100 UNIT/ML ~~LOC~~ SOLN
25.0000 [IU] | Freq: Two times a day (BID) | SUBCUTANEOUS | Status: DC
Start: 2024-07-06 — End: 2024-07-08
  Administered 2024-07-06 – 2024-07-07 (×4): 25 [IU] via SUBCUTANEOUS
  Filled 2024-07-06 (×9): qty 0.25

## 2024-07-06 MED ORDER — PANTOPRAZOLE SODIUM 40 MG PO TBEC
40.0000 mg | DELAYED_RELEASE_TABLET | Freq: Two times a day (BID) | ORAL | Status: DC
  Administered 2024-07-06 – 2024-07-08 (×5): 40 mg via ORAL
  Filled 2024-07-06 (×6): qty 1

## 2024-07-06 MED ORDER — ACETAMINOPHEN 650 MG RE SUPP: 650.0000 mg | Freq: Four times a day (QID) | RECTAL | Status: DC | PRN

## 2024-07-06 MED ORDER — INFLUENZA VAC SPLIT HIGH-DOSE 0.5 ML IM SUSY
0.5000 mL | PREFILLED_SYRINGE | INTRAMUSCULAR | Status: AC
Start: 2024-07-07 — End: 2024-07-08
  Administered 2024-07-08: 0.5 mL via INTRAMUSCULAR
  Filled 2024-07-06: qty 0.5

## 2024-07-06 MED ORDER — ALBUTEROL SULFATE (2.5 MG/3ML) 0.083% IN NEBU: 2.5000 mg | INHALATION_SOLUTION | RESPIRATORY_TRACT | Status: DC | PRN

## 2024-07-06 MED ORDER — PRAZOSIN HCL 2 MG PO CAPS
4.0000 mg | ORAL_CAPSULE | Freq: Every day | ORAL | Status: DC
  Administered 2024-07-07 (×2): 4 mg via ORAL
  Filled 2024-07-06 (×4): qty 2

## 2024-07-06 MED ORDER — ATORVASTATIN CALCIUM 40 MG PO TABS
40.0000 mg | ORAL_TABLET | Freq: Every day | ORAL | Status: DC
  Administered 2024-07-06 – 2024-07-07 (×2): 40 mg via ORAL
  Filled 2024-07-06 (×2): qty 1

## 2024-07-06 MED ORDER — AZITHROMYCIN 250 MG PO TABS
500.0000 mg | ORAL_TABLET | Freq: Every day | ORAL | Status: DC
  Administered 2024-07-07 – 2024-07-08 (×2): 500 mg via ORAL
  Filled 2024-07-06 (×2): qty 2

## 2024-07-06 MED ORDER — METOPROLOL TARTRATE 25 MG PO TABS
75.0000 mg | ORAL_TABLET | Freq: Two times a day (BID) | ORAL | Status: DC
  Administered 2024-07-06 – 2024-07-08 (×5): 75 mg via ORAL
  Filled 2024-07-06: qty 1
  Filled 2024-07-06 (×2): qty 3
  Filled 2024-07-06 (×3): qty 1

## 2024-07-06 MED ORDER — SODIUM CHLORIDE 0.9 % IV SOLN
2.0000 g | INTRAVENOUS | Status: DC
  Administered 2024-07-07 – 2024-07-08 (×2): 2 g via INTRAVENOUS
  Filled 2024-07-06 (×2): qty 20

## 2024-07-06 MED ORDER — HYDRALAZINE HCL 20 MG/ML IJ SOLN: 10.0000 mg | INTRAMUSCULAR | Status: DC | PRN

## 2024-07-06 MED ORDER — SODIUM CHLORIDE 0.9 % IV SOLN
1.0000 g | Freq: Once | INTRAVENOUS | Status: AC
  Administered 2024-07-06: 1 g via INTRAVENOUS
  Filled 2024-07-06: qty 10

## 2024-07-06 MED ORDER — ACETAMINOPHEN 325 MG PO TABS
650.0000 mg | ORAL_TABLET | Freq: Four times a day (QID) | ORAL | Status: DC | PRN
  Administered 2024-07-06 – 2024-07-07 (×2): 650 mg via ORAL
  Filled 2024-07-06 (×2): qty 2

## 2024-07-06 MED ORDER — IBUPROFEN 400 MG PO TABS
400.0000 mg | ORAL_TABLET | Freq: Once | ORAL | Status: AC
Start: 2024-07-06 — End: 2024-07-06
  Administered 2024-07-06: 400 mg via ORAL
  Filled 2024-07-06: qty 1

## 2024-07-06 MED ORDER — SODIUM CHLORIDE 0.9 % IV SOLN: 1.0000 g | Freq: Once | INTRAVENOUS | Status: DC

## 2024-07-06 NOTE — ED Notes (Signed)
 Floor notified patient coming up

## 2024-07-06 NOTE — ED Triage Notes (Signed)
 PT brought in by [facility], PT states she has had weakness and flu/covid symptoms. PT HR was 130 and BP elevated to 200/90. PT states she dosent feel well and didn't take her home BP meds or home insulin . PT glucose was 330 per ems. PT alert and talking. Had symptoms for 2 days

## 2024-07-06 NOTE — Progress Notes (Signed)
 Patient arrived to 6N09 A&O X 4 with a 0/10 pain. Bed in lowest position and call light within reach.

## 2024-07-06 NOTE — H&P (Signed)
 History and Physical    Patient: Tara Patel FMW:[id number] DOB: 1955/10/19 DOA: 07/06/2024 DOS: the patient was seen and examined on 07/06/2024 PCP: [facility], [place]  Patient coming from: Home  Chief Complaint:  Chief Complaint  Patient presents with   Influenza   Weakness   HPI: Tara Patel is a 68 y.o. female with medical history significant of hypertension, hyperlipidemia, CAD, diabetes mellitus type 2, depression, GERD, obesity presents for flu-like symptoms.  Flu-like symptoms began two days ago, characterized by fever, generalized body aches, and a nonproductive cough. She did not measure her fever but felt febrile. No recent sick contacts were noted.  Chest pain occurs when coughing and is associated with vomiting, which she attributes to the coughing. She also reports stomach pain located in the right upper quadrant. Dizziness is experienced when vomiting.  She feels weak and has difficulty with mobility, although she does not normally use assistive devices.  She has a history of diabetes and is currently on insulin  therapy. Her diabetes is not well controlled, according to the patient's report.  She has obstructive sleep apnea but does not use a CPAP machine.  In the ED patient was noted to be febrile up to 102.7 F with tachycardia and tachypnea meeting SIRS criteria.  Blood pressures noted to be elevated up to 200/90, but O2 saturations were maintained on room air.  Labs significant for WBC 12.1, hemoglobin 11.6, potassium 3, BUN 10, creatinine 1.19, glucose 354, anion gap 14, lactic acid 3->1.6, and high-sensitivity troponin 41 -> 1.6.  CT scan of the chest without contrast noted in interval development of mild bronchiectasis and patchy consolidative airspace disease in the lower lobes bilaterally concerning for multifocal pneumonia with 18 mm nodular component in the left lower lobe.  Blood cultures were obtained.  Patient had been given 1 L of lactated Ringer 's,  potassium chloride  40 meq, acetaminophen  1000 mg p.o. Rocephin , azithromycin , and ibuprofen. Review of Systems: As mentioned in the history of present illness. All other systems reviewed and are negative. Past Medical History:  Diagnosis Date   Asthma    Chest pain    Mild nonobstructive CAD (by cath Aug '10)   CKD (chronic kidney disease) stage 3, GFR 30-59 ml/min (HCC)    Coronary artery disease    Mild, nonobstructive by catheterization 05/23/2009   Depression    Diabetes mellitus    GERD (gastroesophageal reflux disease)    Hypercholesteremia    Hypertension    Normocytic anemia 10/19/2013   Obesity    OSA (obstructive sleep apnea)    CPAP Q HS   Past Surgical History:  Procedure Laterality Date   ABDOMINAL HYSTERECTOMY     ADENOIDECTOMY     NASAL SEPTUM SURGERY     REPLACEMENT TOTAL KNEE     TONSILLECTOMY     TUBAL LIGATION     Social History:  reports that she has never smoked. She has never used smokeless tobacco. She reports that she does not currently use alcohol. She reports that she does not use drugs.  Allergies  Allergen Reactions   Baclofen Nausea And Vomiting and Nausea Only    Other Reaction(s): Nausea and Vomiting, Abdominal Pain, Crampy, Nausea and Vomiting, Abdominal Pain, Crampy, Stomach cramps   Metformin Other (See Comments)    Family History  Problem Relation Age of Onset   CAD Mother        s/p triple bypass   Diabetes Mother    Hypertension Mother    Cancer  Other    Diabetes Other    Cancer Sister        Colon   Cancer Brother        Colon   Diabetes Brother     Prior to Admission medications   Medication Sig Start Date End Date Taking? Authorizing Provider  atorvastatin  (LIPITOR) 40 MG tablet Take 40 mg by mouth at bedtime. 06/15/24  Yes [provider]  buPROPion  (WELLBUTRIN  XL) 150 MG 24 hr tablet Take 150 mg by mouth daily.    [provider]  busPIRone  (BUSPAR ) 15 MG tablet Take 15 mg by mouth daily.    [provider]  Calcium  Carbonate-Vitamin D  600-400 MG-UNIT tablet Take 2 tablets by mouth daily.    [provider]  HUMALOG  KWIKPEN 100 UNIT/ML KwikPen Inject 15 Units into the skin as directed. If sugar is above 200 02/02/24   Kingsley, Victoria K, DO  insulin  aspart (NOVOLOG ) 100 UNIT/ML FlexPen Inject 15 Units into the skin 3 (three) times daily with meals. 02/03/24   Donnajean Lynwood DEL, PA-C  insulin  glargine (LANTUS ) 100 UNIT/ML injection Inject 0.2 mLs (20 Units total) into the skin 2 (two) times daily. 02/02/24   Kingsley, Victoria K, DO  LANTUS  SOLOSTAR 100 UNIT/ML Solostar Pen Inject 25 Units into the skin 2 (two) times daily. 06/15/24   [provider]  losartan (COZAAR) 100 MG tablet Take 100 mg by mouth every morning. 06/15/24   [provider]  magnesium  oxide (MAG-OX) 400 (240 Mg) MG tablet Take 1 tablet by mouth every morning. 06/15/24   [provider]  metoprolol tartrate (LOPRESSOR) 50 MG tablet Take 75 mg by mouth 2 (two) times daily. 06/15/24   [provider]  mirtazapine (REMERON) 30 MG tablet Take 15 mg by mouth at bedtime. 06/15/24   [provider]  omeprazole (PRILOSEC) 20 MG capsule Take 20 mg by mouth 2 (two) times daily. 06/15/24   [provider]  omeprazole (PRILOSEC) 40 MG capsule Take 40 mg by mouth 2 (two) times daily. 12/16/20   [provider]  Peppermint Oil (IBGARD) 90 MG CPCR Take 90 mg by mouth daily.    [provider]  prazosin (MINIPRESS) 2 MG capsule Take 2 mg by mouth at bedtime.    [provider]  traZODone  (DESYREL ) 50 MG tablet Take 50 mg by mouth at bedtime.    [provider]  venlafaxine  XR (EFFEXOR -XR) 150 MG 24 hr capsule Take 150 mg by mouth every morning. 06/15/24   [provider]  simvastatin  (ZOCOR ) 40 MG tablet Take 20 mg by mouth at bedtime.    07/23/19  [provider]    Physical Exam: Vitals:   07/06/24 0500 07/06/24 0522  07/06/24 0530 07/06/24 0630  BP: (!) 199/75  (!) 188/76 (!) 166/75  Pulse: (!) 121  (!) 118 (!) 113  Resp: (!) 23  (!) 23 (!) 22  Temp:  (!) 100.5 F (38.1 C)    TempSrc:  Oral    SpO2: 97%  97% 96%  Weight:      Height:        Constitutional: In female who appears acutely ill, but able to follow commands Eyes: PERRL, lids and conjunctivae normal ENMT: Mucous membranes are moist. Posterior pharynx clear of any exudate or lesions.Normal dentition.  Neck: normal, supple Respiratory: Normal respiratory effort with rhonchi appreciated in lower lung fields.  O2 saturations currently maintained on room air Cardiovascular: Regular rate and rhythm, no  murmurs / rubs / gallops.  Trace to 1+ pitting edema of the lower extremities Abdomen: no tenderness, no masses palpated. Bowel sounds positive.  Musculoskeletal: no clubbing / cyanosis. No joint deformity upper and lower extremities. Good ROM, no contractures. Normal muscle tone.  Skin: no rashes, lesions, ulcers. No induration Neurologic: CN 2-12 grossly intact.  Moves all extremities Psychiatric: Normal judgment and insight. Alert and oriented x 3. Normal mood.   Data Reviewed:  EKG reveals sinus tachycardia 119 bpm with diffuse nonspecific T wave abnormality.  Reviewed labs, imaging, and pertinent records as documented.  Assessment and Plan:  Sepsis secondary to pneumonia Patient presented with fever, chills, and cough.  Fever up to 102.7 F with tachycardia and tachypnea meeting SIRS criteria.  CT of the chest noted concern for possible multifocal pneumonia.  Patient had been given 1 L IV fluid bolus, ceftriaxone , and azithromycin .  Lactic acid was noted to be elevated initially at 3,  but repeat check within normal limits. - Admit to a telemetry bed - Incentive spirometry and flutter valve - Follow-up blood cultures - Check procalcitonin - Check urine Legionella and strep - Continue empiric antibiotics of Rocephin  and azithromycin  -  Mucinex  - Acetaminophen  as needed for fever  Hypertensive urgency Blood pressures initially elevated up to 200/90 - Continue home blood pressure regimen and metoprolol, losartan, and prazosin - Hydralazine  IV as needed  Uncontrolled diabetes mellitus type 2, with long-term use of insulin  On admission glucose noted to be elevated up to 354.  Last available hemoglobin A1c noted to be 14.3 when checked 01/2024. - Hypoglycemic protocols - Hold metformin - Pharmacy substitution of Semglee  25 units twice daily - CBGs before every meal with resistant SSI - Adjust insulin  regimen as deemed necessary  Hyperlipidemia - Continue atorvastatin   Chronic kidney disease stage IIIb Creatinine 1.19 which appears around patient's baseline - Continue to monitor  Pulmonary nodule CT noted 18 mm pulmonary nodule in the left lower lobe.  Possibly related with pneumonia.  Patient without significant risk factors  Depression - Continue venlafaxine  and mirtazapine  OSA - Continue CPAP  Obesity, class I BMI 33.28 kg/m  GERD - Continue pharmacy substitution of Protonix  for omeprazole  DVT prophylaxis: Lovenox  Advance Care Planning:   Code Status: Full Code    Consults: None  Family Communication: None requested  Severity of Illness: The appropriate patient status for this patient is INPATIENT. Inpatient status is judged to be reasonable and necessary in order to provide the required intensity of service to ensure the patient's safety. The patient's presenting symptoms, physical exam findings, and initial radiographic and laboratory data in the context of their chronic comorbidities is felt to place them at high risk for further clinical deterioration. Furthermore, it is not anticipated that the patient will be medically stable for discharge from the hospital within 2 midnights of admission.   * I certify that at the point of admission it is my clinical judgment that the patient will require  inpatient hospital care spanning beyond 2 midnights from the point of admission due to high intensity of service, high risk for further deterioration and high frequency of surveillance required.*  Author: Maximino DELENA Sharps, MD 07/06/2024 8:27 AM  For on call review [email or web address].

## 2024-07-06 NOTE — ED Provider Notes (Signed)
 [place] EMERGENCY DEPARTMENT AT [facility] Provider Note   CSN: [id number] Arrival date & time: 07/06/24  9857     Patient presents with: Influenza and Weakness   Tara Patel is a 68 y.o. female.   HPI     This is a 68 year old female who presents with flulike symptoms.  Patient reports 1 day history of worsening flulike symptoms.  She describes low-grade fever, chills, cough.  She states she does not feel well.  Has had some nausea without vomiting.  No known sick contacts.  Denies chest pain but does feel short of breath and has had a cough.  Prior to Admission medications   Medication Sig Start Date End Date Taking? Authorizing Provider  amLODipine  (NORVASC ) 10 MG tablet Take 10 mg by mouth daily. 01/27/21   [provider]  atorvastatin  (LIPITOR) 20 MG tablet Take 20 mg by mouth daily. 01/03/21   [provider]  buPROPion  (WELLBUTRIN  XL) 150 MG 24 hr tablet Take 150 mg by mouth daily.    [provider]  busPIRone  (BUSPAR ) 15 MG tablet Take 15 mg by mouth daily.    [provider]  Calcium  Carbonate-Vitamin D  600-400 MG-UNIT tablet Take 2 tablets by mouth daily.    [provider]  glucose blood (ACCU-CHEK GUIDE) test strip 1 each by Other route 3 (three) times daily. for testing 02/02/24   Kingsley, Victoria K, DO  HUMALOG  KWIKPEN 100 UNIT/ML KwikPen Inject 15 Units into the skin as directed. If sugar is above 200 02/02/24   Kingsley, Victoria K, DO  insulin  aspart (NOVOLOG ) 100 UNIT/ML FlexPen Inject 15 Units into the skin 3 (three) times daily with meals. 02/03/24   Donnajean Lynwood DEL, PA-C  insulin  glargine (LANTUS ) 100 UNIT/ML injection Inject 0.2 mLs (20 Units total) into the skin 2 (two) times daily. 02/02/24   Kingsley, Victoria K, DO  omeprazole (PRILOSEC) 40 MG capsule Take 40 mg by mouth 2 (two) times daily. 12/16/20   [provider]  Peppermint Oil (IBGARD) 90 MG CPCR Take 90 mg by mouth daily.     [provider]  prazosin (MINIPRESS) 2 MG capsule Take 2 mg by mouth at bedtime.    [provider]  traZODone  (DESYREL ) 50 MG tablet Take 50 mg by mouth at bedtime.    [provider]  venlafaxine  (EFFEXOR ) 25 MG tablet Take 75 mg by mouth 2 (two) times daily.    [provider]  simvastatin  (ZOCOR ) 40 MG tablet Take 20 mg by mouth at bedtime.    07/23/19  [provider]    Allergies: Baclofen and Metformin    Review of Systems  Constitutional:  Positive for chills and fever.  Respiratory:  Positive for cough and shortness of breath.   Cardiovascular:  Negative for chest pain.  Gastrointestinal:  Positive for nausea. Negative for abdominal pain.  All other systems reviewed and are negative.   Updated Vital Signs BP (!) 188/76   Pulse (!) 118   Temp (!) 100.5 F (38.1 C) (Oral)   Resp (!) 23   Ht 1.651 m (5' 5)   Wt 90.7 kg   SpO2 97%   BMI 33.28 kg/m   Physical Exam Vitals and nursing note reviewed.  Constitutional:      Appearance: She is well-developed. She is obese. She is ill-appearing. She is not toxic-appearing.  HENT:     Head: Normocephalic and atraumatic.     Mouth/Throat:     Mouth:  Mucous membranes are moist.  Eyes:     Pupils: Pupils are equal, round, and reactive to light.  Cardiovascular:     Rate and Rhythm: Regular rhythm. Tachycardia present.     Heart sounds: Normal heart sounds.  Pulmonary:     Effort: Pulmonary effort is normal. No respiratory distress.     Breath sounds: Rhonchi present. No wheezing.  Abdominal:     General: Bowel sounds are normal.     Palpations: Abdomen is soft.     Tenderness: There is no abdominal tenderness. There is no guarding or rebound.  Musculoskeletal:     Cervical back: Neck supple.  Skin:    General: Skin is warm and dry.  Neurological:     Mental Status: She is alert and oriented to person, place, and time.  Psychiatric:        Mood and Affect: Mood normal.      (all labs ordered are listed, but only abnormal results are displayed) Labs Reviewed  COMPREHENSIVE METABOLIC PANEL WITH GFR - Abnormal; Notable for the following components:      Result Value   Potassium 3.0 (*)    Chloride 96 (*)    Glucose, Bld 354 (*)    Creatinine, Ser 1.19 (*)    Calcium  8.6 (*)    Albumin 3.3 (*)    AST 44 (*)    Alkaline Phosphatase 210 (*)    GFR, Estimated 49 (*)    All other components within normal limits  CBC WITH DIFFERENTIAL/PLATELET - Abnormal; Notable for the following components:   WBC 12.1 (*)    Hemoglobin 11.6 (*)    MCH 25.3 (*)    Neutro Abs 11.0 (*)    Lymphs Abs 0.5 (*)    All other components within normal limits  I-STAT CG4 LACTIC ACID, ED - Abnormal; Notable for the following components:   Lactic Acid, Venous 3.0 (*)    All other components within normal limits  TROPONIN I (HIGH SENSITIVITY) - Abnormal; Notable for the following components:   Troponin I (High Sensitivity) 41 (*)    All other components within normal limits  TROPONIN I (HIGH SENSITIVITY) - Abnormal; Notable for the following components:   Troponin I (High Sensitivity) 29 (*)    All other components within normal limits  RESP PANEL BY RT-PCR (RSV, FLU A&B, COVID)  RVPGX2  CULTURE, BLOOD (ROUTINE X 2)  CULTURE, BLOOD (ROUTINE X 2)  PROTIME-INR  URINALYSIS, W/ REFLEX TO CULTURE (INFECTION SUSPECTED)  I-STAT CG4 LACTIC ACID, ED    EKG: EKG Interpretation Date/Time:  Thursday July 06 2024 03:44:06 EDT Ventricular Rate:  119 PR Interval:  166 QRS Duration:  82 QT Interval:  339 QTC Calculation: 477 R Axis:   60  Text Interpretation: Sinus tachycardia Nonspecific T abnormalities, diffuse leads 12 Lead; Mason-Likar Confirmed by Bari Pfeiffer ([id number]) on 07/06/2024 4:01:48 AM  Radiology: CT Chest Wo Contrast Result Date: 07/06/2024 CLINICAL DATA:  Respiratory illness. EXAM: CT CHEST WITHOUT CONTRAST TECHNIQUE: Multidetector CT imaging of the chest was  performed following the standard protocol without IV contrast. RADIATION DOSE REDUCTION: This exam was performed according to the departmental dose-optimization program which includes automated exposure control, adjustment of the mA and/or kV according to patient size and/or use of iterative reconstruction technique. COMPARISON:  02/20/2021 FINDINGS: Cardiovascular: The heart size is normal. No substantial pericardial effusion. Coronary artery calcification is evident. Mild atherosclerotic calcification is noted in the wall of the thoracic aorta. Enlargement of the pulmonary outflow  tract/main pulmonary arteries suggests pulmonary arterial hypertension. Mediastinum/Nodes: 13 mm right thyroid nodule again identified. This has been evaluated on previous imaging. (ref: J Am Coll Radiol. 2015 Feb;12(2): 143-50).No mediastinal lymphadenopathy. No evidence for gross hilar lymphadenopathy although assessment is limited by the lack of intravenous contrast on the current study. The esophagus has normal imaging features. There is no axillary lymphadenopathy. Lungs/Pleura: 4 mm right perifissural nodule on 59/4 is likely a subpleural lymph node, stable. Interval development of circumferential wall thickening with mild bronchiectasis and patchy consolidative airspace disease in the lower lobes bilaterally including dominant 18 mm nodular component in the left lower lobe on 83/4. There is some atelectasis in the right middle lobe and lingula. No substantial pleural effusion. Upper Abdomen: Visualized portion of the upper abdomen shows no acute findings. Musculoskeletal: No worrisome lytic or sclerotic osseous abnormality. IMPRESSION: 1. Interval development of circumferential wall thickening with mild bronchiectasis and patchy consolidative airspace disease in the lower lobes bilaterally including dominant 18 mm nodular component in the left lower lobe. Imaging features are compatible with multifocal pneumonia. Given nodular  character in some regions, close follow-up warranted to ensure complete resolution. 2. Enlargement of the pulmonary outflow tract/main pulmonary arteries suggests pulmonary arterial hypertension. 3.  Aortic Atherosclerosis (ICD10-I70.0). Electronically Signed   By: Camellia Candle M.D.   On: 07/06/2024 06:03   DG Chest Port 1 View Result Date: 07/06/2024 EXAM: 1 VIEW XRAY OF THE CHEST 07/06/2024 02:42:12 AM COMPARISON: Comparison with 02/11/2024. CLINICAL HISTORY: Questionable sepsis - evaluate for abnormality. FINDINGS: LUNGS AND PLEURA: No focal pulmonary opacity. No pulmonary edema. No pleural effusion. No pneumothorax. HEART AND MEDIASTINUM: Stable cardiomediastinal silhouette. Aortic atherosclerotic calcification. BONES AND SOFT TISSUES: No acute osseous abnormality. IMPRESSION: 1. No acute abnormalities. Electronically signed by: Norman Gatlin MD 07/06/2024 02:47 AM EDT RP Workstation: [id number]     .Critical Care  Performed by: Bari Charmaine FALCON, MD Authorized by: Bari Charmaine FALCON, MD   Critical care provider statement:    Critical care time (minutes):  40   Critical care was necessary to treat or prevent imminent or life-threatening deterioration of the following conditions: pneumonia.   Critical care was time spent personally by me on the following activities:  Development of treatment plan with patient or surrogate, discussions with consultants, evaluation of patient's response to treatment, examination of patient, ordering and review of laboratory studies, ordering and review of radiographic studies, ordering and performing treatments and interventions, pulse oximetry, re-evaluation of patient's condition and review of old charts    Medications Ordered in the ED  cefTRIAXone  (ROCEPHIN ) 1 g in sodium chloride  0.9 % 100 mL IVPB (has no administration in time range)  azithromycin  (ZITHROMAX ) 500 mg in sodium chloride  0.9 % 250 mL IVPB (has no administration in time range)  lactated  ringers  bolus 1,000 mL (0 mLs Intravenous Stopped 07/06/24 0407)  acetaminophen  (TYLENOL ) tablet 1,000 mg (1,000 mg Oral Given 07/06/24 0337)  potassium chloride  SA (KLOR-CON  M) CR tablet 40 mEq (40 mEq Oral Given 07/06/24 0337)  lactated ringers  bolus 1,000 mL (1,000 mLs Intravenous New Bag/Given 07/06/24 0525)  ibuprofen (ADVIL) tablet 400 mg (400 mg Oral Given 07/06/24 0526)    Clinical Course as of 07/06/24 0618  Thu Jul 06, 2024  0518 Persistently tachycardic.  Troponin downtrending.  Does have a slight AKI.  No active chest pain.  Reports persistent body aches.  Will order second liter of fluids and small dose of ibuprofen given renal function.  Cannot get another dose of  Tylenol  at this time. [CH]    Clinical Course User Index [CH] Christpher Stogsdill, Charmaine FALCON, MD                                 Medical Decision Making Amount and/or Complexity of Data Reviewed Labs: ordered. Radiology: ordered.  Risk OTC drugs. Prescription drug management. Decision regarding hospitalization.   This patient presents to the ED for concern of flulike symptoms, this involves an extensive number of treatment options, and is a complaint that carries with it a high risk of complications and morbidity.  I considered the following differential and admission for this acute, potentially life threatening condition.  The differential diagnosis includes viral illness such as COVID or influenza, pneumonia, UTI  MDM:    This is a 68 year old female who presents with flulike symptoms.  Febrile to 102.7.  Significantly tachycardic.  Sepsis workup was initiated.  She was started with 1 L of fluids and given Tylenol  for her fever.  Blood pressure is actually high at 200/90.  Labs obtained.  COVID and flu negative.  Lactate 3.0.  This normalized after 1 L of fluids.  Slightly low potassium at 3.0.  This was replaced.  Creatinine 1.19 which is at the patient's baseline.  She does have a leukocytosis to 12 with a left shift.  Not  having any active chest pain but states she had some chest pain during this time.  Troponins trended from 41-29.  EKG without acute ischemic changes.  Doubt ACS.  May be driven by tachycardia.  Given no obvious source as chest x-ray is clear, chest CT was obtained.  Chest CT does indicate likely pneumonia.  She is not showing any signs of septic shock.  She is however persistently tachycardic but remains febrile.  Additional fluids ordered.  She was covered with Rocephin  and azithromycin .  Will plan for admission to the hospital.  (Labs, imaging, consults)  Labs: I Ordered, and personally interpreted labs.  The pertinent results include: CBC, CMP, blood cultures x 2, troponin, lactate  Imaging Studies ordered: I ordered imaging studies including chest x-ray, chest CT I independently visualized and interpreted imaging. I agree with the radiologist interpretation  Additional history obtained from chart review.  External records from outside source obtained and reviewed including prior evaluations  Cardiac Monitoring: The patient was maintained on a cardiac monitor.  If on the cardiac monitor, I personally viewed and interpreted the cardiac monitored which showed an underlying rhythm of: Sinus tachycardia  Reevaluation: After the interventions noted above, I reevaluated the patient and found that they have :improved  Social Determinants of Health:  lives independently  Disposition: Discharge  Co morbidities that complicate the patient evaluation  Past Medical History:  Diagnosis Date   Asthma    Chest pain    Mild nonobstructive CAD (by cath Aug '10)   CKD (chronic kidney disease) stage 3, GFR 30-59 ml/min (HCC)    Coronary artery disease    Mild, nonobstructive by catheterization 05/23/2009   Depression    Diabetes mellitus    GERD (gastroesophageal reflux disease)    Hypercholesteremia    Hypertension    Normocytic anemia 10/19/2013   Obesity    OSA (obstructive sleep apnea)     CPAP Q HS     Medicines Meds ordered this encounter  Medications   lactated ringers  bolus 1,000 mL   acetaminophen  (TYLENOL ) tablet 1,000 mg   potassium chloride  SA (  KLOR-CON  M) CR tablet 40 mEq   lactated ringers  bolus 1,000 mL   ibuprofen (ADVIL) tablet 400 mg   cefTRIAXone  (ROCEPHIN ) 1 g in sodium chloride  0.9 % 100 mL IVPB    Antibiotic Indication::   CAP   azithromycin  (ZITHROMAX ) 500 mg in sodium chloride  0.9 % 250 mL IVPB    I have reviewed the patients home medicines and have made adjustments as needed  Problem List / ED Course: Problem List Items Addressed This Visit   None Visit Diagnoses       Pneumonia due to infectious organism, unspecified laterality, unspecified part of lung    -  Primary   Relevant Medications   cefTRIAXone  (ROCEPHIN ) 1 g in sodium chloride  0.9 % 100 mL IVPB (Start on 07/06/2024  6:30 AM)   azithromycin  (ZITHROMAX ) 500 mg in sodium chloride  0.9 % 250 mL IVPB (Start on 07/06/2024  6:30 AM)     AKI (acute kidney injury)                    Final diagnoses:  Pneumonia due to infectious organism, unspecified laterality, unspecified part of lung  AKI (acute kidney injury)    ED Discharge Orders     None          Bari Charmaine FALCON, MD 07/06/24 [phone]

## 2024-07-07 LAB — COMPREHENSIVE METABOLIC PANEL WITH GFR
ALT: 43 U/L (ref 0–44)
AST: 63 U/L — ABNORMAL HIGH (ref 15–41)
Albumin: 2.4 g/dL — ABNORMAL LOW (ref 3.5–5.0)
Alkaline Phosphatase: 147 U/L — ABNORMAL HIGH (ref 38–126)
Anion gap: 9 (ref 5–15)
BUN: 17 mg/dL (ref 8–23)
CO2: 29 mmol/L (ref 22–32)
Calcium: 8.4 mg/dL — ABNORMAL LOW (ref 8.9–10.3)
Chloride: 103 mmol/L (ref 98–111)
Creatinine, Ser: 1.4 mg/dL — ABNORMAL HIGH (ref 0.44–1.00)
GFR, Estimated: 40 mL/min — ABNORMAL LOW (ref >60)
Glucose, Bld: 163 mg/dL — ABNORMAL HIGH (ref 70–99)
Potassium: 3.9 mmol/L (ref 3.5–5.1)
Sodium: 141 mmol/L (ref 135–145)
Total Bilirubin: 1.4 mg/dL — ABNORMAL HIGH (ref 0.0–1.2)
Total Protein: 5.2 g/dL — ABNORMAL LOW (ref 6.5–8.1)

## 2024-07-07 LAB — GLUCOSE, CAPILLARY
Glucose-Capillary: 159 mg/dL — ABNORMAL HIGH (ref 70–99)
Glucose-Capillary: 225 mg/dL — ABNORMAL HIGH (ref 70–99)
Glucose-Capillary: 118 mg/dL — ABNORMAL HIGH (ref 70–99)
Glucose-Capillary: 162 mg/dL — ABNORMAL HIGH (ref 70–99)

## 2024-07-07 LAB — CBC
HCT: 30 % — ABNORMAL LOW (ref 36.0–46.0)
Hemoglobin: 9.6 g/dL — ABNORMAL LOW (ref 12.0–15.0)
MCH: 25.7 pg — ABNORMAL LOW (ref 26.0–34.0)
MCHC: 32 g/dL (ref 30.0–36.0)
MCV: 80.2 fL (ref 80.0–100.0)
Platelets: 186 K/uL (ref 150–400)
RBC: 3.74 MIL/uL — ABNORMAL LOW (ref 3.87–5.11)
RDW: 13.2 % (ref 11.5–15.5)
WBC: 9.3 K/uL (ref 4.0–10.5)
nRBC: 0 % (ref 0.0–0.2)

## 2024-07-07 NOTE — Progress Notes (Signed)
   07/07/24 2209  BiPAP/CPAP/SIPAP  $ Non-Invasive Home Ventilator  Initial  $ Face Mask Medium Yes  BiPAP/CPAP/SIPAP Pt Type Adult  BiPAP/CPAP/SIPAP Resmed  Mask Type Full face mask  Dentures removed? (S)  Yes - Placed in denture cup (placed in Pink denture cup on the bedside table.)  Mask Size Medium  Respiratory Rate 18 breaths/min  PEEP 5 cmH20  FiO2 (%) 21 %  Patient Home Machine No  Patient Home Mask No  Patient Home Tubing No  Auto Titrate No  Device Plugged into RED Power Outlet Yes

## 2024-07-07 NOTE — Evaluation (Signed)
 Physical Therapy Evaluation Patient Details Name: Tara Patel MRN: [id number] DOB: 11/24/1955 Today's Date: 07/07/2024  History of Present Illness  Tara Patel is a 68 y.o. female who presented to [facility] 07/06/24 for flu-like symptoms. In the ED, pt febrile up to 102.7 F with tachycardia and tachypnea. CT chest noted interval development of mild bronchiectasis and patchy consolidative airspace disease in the lower lobes bilaterally concerning for multifocal pneumonia with 18 mm nodular component in the left lower lobe. PMHx: HTN, HLD, CAD, CKD stage 3, T2DM, depression, GERD, asthma, OSA, and obesity.   Clinical Impression  Pt admitted with above diagnosis. PTA, pt was independent with functional mobility, ADLs/IADLs, driving, and attending school. She lives with her daughter in a two story house with a level entry and 16 steps to the bedroom and full bathroom. Pt currently with functional limitations due to the deficits listed below (see PT Problem List). She performed bed mobility and transfers with modI. Pt required CGA for gait. She ambulate with a reciprocal gait pattern initially using IV pole for support, but able to transition to no UE support. She completed standing marches with 1 HHA on R to simulate stair climbing within her home environment. Pt is currently limited by impaired cardiopulmonary endurance and pain. Pt will benefit from acute skilled PT to increase her independence and safety with mobility to allow discharge. Anticipate pt to progress well without need for follow-up PT at d/c.     If plan is discharge home, recommend the following: Help with stairs or ramp for entrance;Assist for transportation   Can travel by private vehicle        Equipment Recommendations None recommended by PT  Recommendations for Other Services       Functional Status Assessment Patient has had a recent decline in their functional status and demonstrates the ability to make significant  improvements in function in a reasonable and predictable amount of time.     Precautions / Restrictions Precautions Precautions: Fall Recall of Precautions/Restrictions: Intact Precaution/Restrictions Comments: Droplet Precautions Restrictions Weight Bearing Restrictions Per Provider Order: No      Mobility  Bed Mobility Overal bed mobility: Modified Independent             General bed mobility comments: Pt performed supine<>sit with HOB slightly elevated and increased time. No use of bedrails.    Transfers Overall transfer level: Modified independent Equipment used: None               General transfer comment: Pt performed sit<>stand and bed>chair transfer while managing IV pole within her room. She took increased time and c/o SOB with activity. Pt manuevered within the room well without LOB.    Ambulation/Gait Ambulation/Gait assistance: Contact guard assist Gait Distance (Feet): 200 Feet Assistive device: IV Pole, None Gait Pattern/deviations: Step-through pattern, Decreased stride length Gait velocity: reduced Gait velocity interpretation: <1.8 ft/sec, indicate of risk for recurrent falls   General Gait Details: Pt ambulated within the hallway wearing a face mask. She demonstrated a reciprocal gait pattern with even weight shift and good foot clearnce. Pt lacked arm swing. She initially used IV pole for support. PT transitioned to managing IV pole and pt maintained BUE support across abdomen d/t discomfort.  Stairs Stairs: Yes Stairs assistance: Contact guard assist Stair Management:  (1 HHA on RUE) Number of Stairs: 10 General stair comments: Pt performed standing marches infront of recliner chair with 1 HHA on R-side. She completed 2x5, required standing rest break between bouts  d/t SOB.  Wheelchair Mobility     Tilt Bed    Modified Rankin (Stroke Patients Only)       Balance Overall balance assessment: Mild deficits observed, not formally  tested                                           Pertinent Vitals/Pain Pain Assessment Pain Assessment: Faces Faces Pain Scale: Hurts little more Pain Location: Abdomen Pain Descriptors / Indicators: Discomfort, Aching, Sore Pain Intervention(s): Monitored during session, Repositioned    Home Living Family/patient expects to be discharged to:: Private residence Living Arrangements: Children (Daughter) Available Help at Discharge: Family;Available PRN/intermittently Type of Home: House Home Access: Level entry     Alternate Level Stairs-Number of Steps: 16 Home Layout: Two level;Bed/bath upstairs Home Equipment: Cane - single point;Rolling Walker (2 wheels)      Prior Function Prior Level of Function : Independent/Modified Independent             Mobility Comments: Ambulates without AD. Denies falls in the past 37mo. ADLs Comments: Indep with ADL/IADLs. Drives. Attending school to become a Engineer, site including in-person and online classes.     Extremity/Trunk Assessment   Upper Extremity Assessment Upper Extremity Assessment: Overall WFL for tasks assessed;Right hand dominant    Lower Extremity Assessment Lower Extremity Assessment: Overall WFL for tasks assessed    Cervical / Trunk Assessment Cervical / Trunk Assessment: Other exceptions Cervical / Trunk Exceptions: Increased Body Habitus  Communication   Communication Communication: No apparent difficulties    Cognition Arousal: Alert Behavior During Therapy: WFL for tasks assessed/performed   PT - Cognitive impairments: No apparent impairments                       PT - Cognition Comments: Pt A,Ox4 Following commands: Intact       Cueing Cueing Techniques: Verbal cues     General Comments General comments (skin integrity, edema, etc.): VSS on RA. SpO2 >95% throughout session.    Exercises Other Exercises Other Exercises: Provided pt with incentive spirometer  and educate her on proper use and technique. She completed 3 reps achieving ~1070mL. Set goal for and instructed pt to complete 10 reps every hour.   Assessment/Plan    PT Assessment Patient needs continued PT services  PT Problem List Cardiopulmonary status limiting activity;Decreased activity tolerance;Decreased balance;Decreased mobility       PT Treatment Interventions DME instruction;Gait training;Stair training;Functional mobility training;Therapeutic activities;Therapeutic exercise;Balance training;Patient/family education    PT Goals (Current goals can be found in the Care Plan section)  Acute Rehab PT Goals Patient Stated Goal: Return Home PT Goal Formulation: With patient Time For Goal Achievement: 07/21/24 Potential to Achieve Goals: Good    Frequency Min 2X/week     Co-evaluation               AM-PAC PT 6 Clicks Mobility  Outcome Measure Help needed turning from your back to your side while in a flat bed without using bedrails?: None Help needed moving from lying on your back to sitting on the side of a flat bed without using bedrails?: None Help needed moving to and from a bed to a chair (including a wheelchair)?: None Help needed standing up from a chair using your arms (e.g., wheelchair or bedside chair)?: None Help needed to walk in hospital room?: None  Help needed climbing 3-5 steps with a railing? : A Little 6 Click Score: 23    End of Session Equipment Utilized During Treatment: Gait belt Activity Tolerance: Patient tolerated treatment well Patient left: in chair;with call bell/phone within reach Nurse Communication: Mobility status;Other (comment) (Pt has been getting to bathroom by herself, RN okay with this and said chair alarm doesn't need to be placed.) PT Visit Diagnosis: Difficulty in walking, not elsewhere classified (R26.2)    Time: 9194-9167 PT Time Calculation (min) (ACUTE ONLY): 27 min   Charges:   PT Evaluation $PT Eval Low  Complexity: 1 Low PT Treatments $Gait Training: 8-22 mins PT General Charges $$ ACUTE PT VISIT: 1 Visit         Randall SAUNDERS, PT, DPT Acute Rehabilitation Services Office: [phone] Secure Chat Preferred  Tara Patel 07/07/2024, 9:49 AM

## 2024-07-07 NOTE — Inpatient Diabetes Management (Signed)
 Inpatient Diabetes Program Recommendations  AACE/ADA: New Consensus Statement on Inpatient Glycemic Control (2015)  Target Ranges:  Prepandial:   less than 140 mg/dL      Peak postprandial:   less than 180 mg/dL (1-2 hours)      Critically ill patients:  140 - 180 mg/dL    Latest Reference Range & Units 02/02/24 09:29 07/06/24 13:22  Hemoglobin A1C 4.8 - 5.6 % 14.3 (H)  363 mg/dl 89.9 (H)  759 mg/dl  (H): Data is abnormally high  Latest Reference Range & Units 07/06/24 08:17 07/06/24 11:54 07/06/24 17:41 07/06/24 21:05  Glucose-Capillary 70 - 99 mg/dL 691 (H) 752 (H) 880 (H) 190 (H)    Latest Reference Range & Units 07/07/24 07:49 07/07/24 12:18  Glucose-Capillary 70 - 99 mg/dL 840 (H)  4 units Novolog   25 units Lantus   225 (H)  7 units Novolog    (H): Data is abnormally high   Admit with:  Sepsis secondary to pneumonia  Hypertensive urgency   History: DM, CKD  Home DM Meds: Lantus  25 units BID       Humalog  5-15 units TID if CBG >200       Metformin 500 mg BID  Current Orders: Novolog  Resistant Correction Scale/ SSI (0-20 units) TID AC + HS     Lantus  25 units BID    [facility]: [facility] [place]   Met with pt at bedside to discuss current A1c.  Pt told me she is a pt at the [facility], but she has recently moved and needs to get established at the [facility].  Currently going to [facility] for care as well.  Has CBG meter and supplies and Insulin  (both basal and rapid acting) at home--Uses the insulin  pens.  Pt told me she gets her insulin  and CBG meter supplies mail ordered to her thru [employer].  Has used the [employer] in the past and currently working with PCP to get upgraded to the FSL3 CGM (per pt report).  Spoke with patient about her current A1c of 10% (which is down from 14.3% back in April 2025).  Explained what an A1c is and what it measures.  Reminded patient that her goal A1c is 7% or less per ADA standards to prevent both acute and long-term  complications.  Explained to patient the extreme importance of good glucose control at home.  Encouraged patient to check her CBGs at least TID at home and to record all CBGs in a logbook for his/her PCP to review.  Strongly encouraged pt to reach out to PCP to get started on the FSL3 glucose sensor.      --Will follow patient during hospitalization--  Adina Rudolpho Arrow RN, MSN, CDCES Diabetes Coordinator Inpatient Glycemic Control Team Team Pager: [phone] (8a-5p)

## 2024-07-07 NOTE — TOC Initial Note (Signed)
 Transition of Care [facility]) - Initial/Assessment Note    Patient Details  Name: Tara Patel MRN: [id number] Date of Birth: 10-20-1955  [facility] of [facility]) CM/SW Contact:    Jeoffrey LITTIE Moose, [place] Phone Number: 07/07/2024, 10:07 AM  Clinical Narrative:                 Pt admitted from home due to weakness & covid/flu symptoms. No current TOC needs, please consult as needs arise.        Patient Goals and CMS Choice            Expected Discharge Plan and Services                                              Prior Living Arrangements/Services                       Activities of Daily Living   ADL Screening (condition at time of admission) Independently performs ADLs?: Yes (appropriate for developmental age) Is the patient deaf or have difficulty hearing?: No Does the patient have difficulty seeing, even when wearing glasses/contacts?: No Does the patient have difficulty concentrating, remembering, or making decisions?: No  Permission Sought/Granted                  Emotional Assessment              Admission diagnosis:  AKI (acute kidney injury) [N17.9] Sepsis due to pneumonia (HCC) [J18.9, A41.9] Pneumonia due to infectious organism, unspecified laterality, unspecified part of lung [J18.9] Patient Active Problem List   Diagnosis Date Noted   Sepsis due to pneumonia (HCC) 07/06/2024   Hypertensive urgency 07/06/2024   Pulmonary nodule 07/06/2024   Obesity, Class I, BMI 30-34.9 07/06/2024   Syncope and collapse 02/20/2021   Chest pain with moderate risk for cardiac etiology 10/19/2013   Hypokalemia 10/19/2013   Cough 10/19/2013   Unspecified chronic bronchitis (HCC) 10/19/2013   Depression 10/19/2013   GERD (gastroesophageal reflux disease) 10/19/2013   Normocytic anemia 10/19/2013   Diabetes mellitus, type 2 (HCC) 10/08/2011   Hypertension 10/08/2011   Hyperlipidemia 10/08/2011   Obesity 10/08/2011   Obstructive sleep  apnea 10/08/2011   CKD (chronic kidney disease) stage 3, GFR 30-59 ml/min (HCC) 10/08/2011   PCP:  [place], [place] Pharmacy:   [facility] - [place], [place] - [address] [place] [address] Phone: [phone] Fax: [phone]  [facility] [address] [place] [address] Phone: [phone] Fax: [phone]     Social Drivers of Health (SDOH) Social History: SDOH Screenings   Food Insecurity: No Food Insecurity (07/06/2024)  Housing: Low Risk  (07/06/2024)  Transportation Needs: No Transportation Needs (07/06/2024)  Utilities: Not At Risk (07/06/2024)  Social Connections: Moderately Isolated (07/06/2024)  Tobacco Use: Low Risk  (07/06/2024)   SDOH Interventions:     Readmission Risk Interventions     No data to display

## 2024-07-07 NOTE — Progress Notes (Deleted)
   07/07/24 2209  BiPAP/CPAP/SIPAP  $ Non-Invasive Home Ventilator  Initial  $ Face Mask Medium Yes  BiPAP/CPAP/SIPAP Pt Type Adult  BiPAP/CPAP/SIPAP Resmed  Mask Type Full face mask  Dentures removed? Not applicable  Mask Size Medium  Respiratory Rate 18 breaths/min  PEEP 5 cmH20  FiO2 (%) 21 %  Patient Home Machine No  Patient Home Mask No  Patient Home Tubing No  Auto Titrate No  Device Plugged into [employer] Power Outlet Yes

## 2024-07-07 NOTE — Plan of Care (Signed)
   Problem: Coping: Goal: Ability to adjust to condition or change in health will improve Outcome: Progressing   Problem: Nutritional: Goal: Maintenance of adequate nutrition will improve Outcome: Progressing

## 2024-07-07 NOTE — Plan of Care (Signed)

## 2024-07-07 NOTE — Hospital Course (Signed)
 68 y.o. F with HTN, CAD, DM and obesity presented with multifocal pneumonia sepsis.

## 2024-07-07 NOTE — Progress Notes (Signed)
  Progress Note   Patient: Tara Patel FMW:[id number] DOB: [birth year] DOA: 07/06/2024     1 DOS: the patient was seen and examined on 07/07/2024 at 9:10AM      Brief hospital course: 68 y.o. F with HTN, CAD, DM and obesity presented with multifocal pneumonia sepsis.     Assessment and Plan: Multifocal pneumonia -Continue Rocephin , azithromycin  - Continue flutter valve, incentive spirometer   Hypertensive urgency Blood pressure elevated on admission, home medications resumed, now back to normal - Continue losartan, metoprolol, prazosin  Diabetes with hyperglycemia Glucose somewhat improved this afternoon -Consult diabetes educator - Continue home Lantus  twice daily - Continue high-dose sliding scale - Continue aspirin , Lipitor  Hyperlipidemia -Continue Lipitor  Chronic kidney disease stage IIIb Creatinine stable relative to baseline  Pulmonary nodule -Repeat CT in 3 months  Depression -Venlafaxine  and mirtazapine  OSA -CPAP  Class I obesity  Anemia of chronic kidney disease No clinical bleeding observed or reported        Subjective: Patient is feeling considerably better, she has got no headache,  shortness of breath.  She still has a lot of cough and chest discomfort with coughing.     Physical Exam: BP (!) 133/94 (BP Location: Left Arm)   Pulse 94   Temp 99 F (37.2 C) (Oral)   Resp 17   Ht 5' 5 (1.651 m)   Wt 90.7 kg   SpO2 94%   BMI 33.28 kg/m   Adult female, sitting up in chair, interactive and appropriate RRR, no murmurs, no peripheral edema Respiratory rate normal, lung sounds with crackles at bilateral bases, worse on the left, no wheezing Abdomen soft, no tenderness palpation or guarding, no ascites or distention Attention normal, affect normal, judgment and insight appear normal    Data Reviewed: Basic metabolic panel shows creatinine slightly up to 1.4, electrolytes normal CBC shows mild anemia, likely down due to  dilution     Family Communication: called daughter by phone, no answer    Disposition: Status is: Inpatient         Author: Lonni SHAUNNA Dalton, MD 07/07/2024 4:29 PM  For on call review [email or web address].

## 2024-07-08 ENCOUNTER — Other Ambulatory Visit (HOSPITAL_COMMUNITY): Payer: Self-pay

## 2024-07-08 DIAGNOSIS — A419 Sepsis, unspecified organism: Secondary | ICD-10-CM | POA: Diagnosis not present

## 2024-07-08 LAB — CBC
HCT: 31.5 % — ABNORMAL LOW (ref 36.0–46.0)
Hemoglobin: 9.7 g/dL — ABNORMAL LOW (ref 12.0–15.0)
MCH: 25.1 pg — ABNORMAL LOW (ref 26.0–34.0)
MCHC: 30.8 g/dL (ref 30.0–36.0)
MCV: 81.4 fL (ref 80.0–100.0)
Platelets: 203 K/uL (ref 150–400)
RBC: 3.87 MIL/uL (ref 3.87–5.11)
RDW: 13.2 % (ref 11.5–15.5)
WBC: 8 K/uL (ref 4.0–10.5)
nRBC: 0 % (ref 0.0–0.2)

## 2024-07-08 LAB — BASIC METABOLIC PANEL WITH GFR
Anion gap: 11 (ref 5–15)
BUN: 10 mg/dL (ref 8–23)
CO2: 26 mmol/L (ref 22–32)
Calcium: 8.3 mg/dL — ABNORMAL LOW (ref 8.9–10.3)
Chloride: 105 mmol/L (ref 98–111)
Creatinine, Ser: 1.39 mg/dL — ABNORMAL HIGH (ref 0.44–1.00)
GFR, Estimated: 41 mL/min — ABNORMAL LOW (ref >60)
Glucose, Bld: 180 mg/dL — ABNORMAL HIGH (ref 70–99)
Potassium: 3.4 mmol/L — ABNORMAL LOW (ref 3.5–5.1)
Sodium: 142 mmol/L (ref 135–145)

## 2024-07-08 LAB — GLUCOSE, CAPILLARY
Glucose-Capillary: 69 mg/dL — ABNORMAL LOW (ref 70–99)
Glucose-Capillary: 86 mg/dL (ref 70–99)
Glucose-Capillary: 224 mg/dL — ABNORMAL HIGH (ref 70–99)

## 2024-07-08 LAB — LEGIONELLA PNEUMOPHILA SEROGP 1 UR AG: L. pneumophila Serogp 1 Ur Ag: NEGATIVE

## 2024-07-08 MED ORDER — INSULIN GLARGINE 100 UNIT/ML ~~LOC~~ SOLN
15.0000 [IU] | Freq: Every day | SUBCUTANEOUS | Status: DC
  Administered 2024-07-08: 15 [IU] via SUBCUTANEOUS
  Filled 2024-07-08: qty 0.15

## 2024-07-08 MED ORDER — AMOXICILLIN-POT CLAVULANATE 875-125 MG PO TABS
1.0000 | ORAL_TABLET | Freq: Two times a day (BID) | ORAL | 0 refills | Status: AC
  Filled 2024-07-08: qty 8, 4d supply, fill #0

## 2024-07-08 MED ORDER — AZITHROMYCIN 250 MG PO TABS
250.0000 mg | ORAL_TABLET | Freq: Every day | ORAL | 0 refills | Status: AC
Start: 2024-07-08 — End: 2024-07-10
  Filled 2024-07-08: qty 2, 2d supply, fill #0

## 2024-07-08 NOTE — Significant Event (Signed)
 0815 patient's blood sugar was 69. Patient has been asymptomatic. Patient was able to drink 4 oz of orange juice, gave crackers. Breakfast tray came, was able to eat breakfast. Informed  Danford, MD and aware with new orders made and carried out. Patient's repeat sugar was 86.

## 2024-07-08 NOTE — Plan of Care (Signed)
  Problem: Nutritional: Goal: Maintenance of adequate nutrition will improve Outcome: Progressing   Problem: Education: Goal: Knowledge of General Education information will improve Description: Including pain rating scale, medication(s)/side effects and non-pharmacologic comfort measures Outcome: Progressing   Problem: Health Behavior/Discharge Planning: Goal: Ability to manage health-related needs will improve Outcome: Progressing   Problem: Activity: Goal: Risk for activity intolerance will decrease Outcome: Progressing   Problem: Nutrition: Goal: Adequate nutrition will be maintained Outcome: Progressing

## 2024-07-08 NOTE — Plan of Care (Signed)

## 2024-07-08 NOTE — Discharge Summary (Signed)
 Physician Discharge Summary   Patient: Tara Patel MRN: [id number] DOB: 1955-12-29  Admit date:     07/06/2024  Discharge date: 07/08/24  Discharge Physician: Lonni SHAUNNA Dalton   PCP: Arloa Jarvis, NP     Recommendations at discharge:  Follow up with PCP Jarvis Arloa in 1 week for pneumonia Jarvis Arloa: Please check blood sugars and blood pressure and titrate diabetes/hypertension medications as appropriate Please obtain CT CHEST in 3 months to document resolution of pneumonia given nodular features     Discharge Diagnoses: Principal Problem:   Sepsis due to pneumonia Active Problems:   Hypertensive urgency   Essential hypertension   Diabetes mellitus, type 2 (HCC)   Hyperlipidemia   CKD (chronic kidney disease) stage 3b, GFR 30-59 ml/min (HCC)   Depression   Obstructive sleep apnea   Obesity, Class I, BMI 30-34.9     Hospital Course: 68 y.o. F with HTN, CAD, DM and obesity presented with multifocal pneumonia sepsis.     Sepsis due to multifocal pneumonia Presented with fever, tachycardia, tachypnea and elevated lactic acid.  Procal >27.  Admitted and started on Rocephin  and azithromycin .    Patient now mentating at baseline, taking orals.  Temp < 100 F, heart rate < 100bpm, RR < 24, SpO2 at baseline.   Stable for discharge on 4 more days Augmentin, 2 more days azithromycin .  CT chest showed some nodularity to appearance of pneumonia.  To exclude underlying mass, recommend CT in 3-6 months, patient informed.     Hypertensive urgency Severe range hypertension on admission without evidence of stroke, renal failure or heart failure.  Home medications resumed and BP improved.  Recommend close follow up with PCP for titration after resolution of pneumonia.      Diabetes with hyperglycemia Presented with hyperglycemia in the setting of pneumonia, without DKA.  Home insulin  resumed and glucoses controlled.  Hypoglycemic on day of discharge, query some  nonadherence at home.    - Recommend close PCP follow up   Chronic kidney disease stage IIIb Creatinine stable relative to baseline   Anemia of chronic kidney disease No clinical bleeding observed or reported               The [place]  Controlled Substances Registry was reviewed for this patient prior to discharge.  Consultants: None   Disposition: Home Diet recommendation:  Discharge Diet Orders (From admission, onward)     Start     Ordered   07/08/24 0000  Diet - low sodium heart healthy        07/08/24 1136             DISCHARGE MEDICATION: Allergies as of 07/08/2024       Reactions   Glucophage [metformin] Diarrhea   Lioresal [baclofen] Nausea And Vomiting, Other (See Comments)   Abdominal pain and cramps        Medication List     TAKE these medications    amoxicillin -clavulanate 875-125 MG tablet Commonly known as: AUGMENTIN Take 1 tablet by mouth 2 (two) times daily.   aspirin  EC 81 MG tablet Take 81 mg by mouth daily.   atorvastatin  40 MG tablet Commonly known as: LIPITOR Take 40 mg by mouth at bedtime.   azithromycin  250 MG tablet Commonly known as: Zithromax  Z-Pak Take 1 tablet (250 mg total) by mouth daily for 2 days.   HumaLOG  KwikPen 100 UNIT/ML KwikPen Generic drug: insulin  lispro Inject 15 Units into the skin as directed. If sugar is above  200 What changed:  how much to take when to take this additional instructions   Lantus  SoloStar 100 UNIT/ML Solostar Pen Generic drug: insulin  glargine Inject 25 Units into the skin 2 (two) times daily.   losartan 100 MG tablet Commonly known as: COZAAR Take 100 mg by mouth daily.   magnesium  oxide 400 (240 Mg) MG tablet Commonly known as: MAG-OX Take 400 mg by mouth daily.   metFORMIN 500 MG 24 hr tablet Commonly known as: GLUCOPHAGE-XR Take 500 mg by mouth 2 (two) times daily.   metoprolol tartrate 50 MG tablet Commonly known as: LOPRESSOR Take 75 mg by mouth 2  (two) times daily.   mirtazapine 30 MG tablet Commonly known as: REMERON Take 15 mg by mouth at bedtime.   omeprazole 20 MG capsule Commonly known as: PRILOSEC Take 20 mg by mouth 2 (two) times daily.   prazosin 2 MG capsule Commonly known as: MINIPRESS Take 4 mg by mouth at bedtime.   venlafaxine  XR 150 MG 24 hr capsule Commonly known as: EFFEXOR -XR Take 150 mg by mouth daily.        Follow-up Information     Health, [address]. Schedule an appointment as soon as possible for a visit in 1 week(s).   Contact information: [address] [place] [address] [phone]                 Discharge Instructions     Diet - low sodium heart healthy   Complete by: As directed    Discharge instructions   Complete by: As directed    **IMPORTANT DISCHARGE INSTRUCTIONS**   From Dr. Jonel: You were admitted for pneumonia  Here, you were treated with antibiotics and this improved, you completed 3 days of the course, you have a few more days  Take the two antibiotics azithromycin  and Augmentin for the next week  Take azithromycin  250 mg once daily on Sunday and Monday morning  (THis is a long acting antibiotic that stays in your system several days after that)  Take Augmentin 875-125 mg twice daily starting Sunday for 4 more days to complete 7 days total  Go see your primary doctor in 1 week IMPORTANT: ask your primary doctor to repeat a CT scan in 3 months to make sure the small nodule in your lungs is not growing.  This is probably just a little scar, but it needs to be followed up  Resume your normal blood pressure and diabetes medicines  USE THE INCENTIVE SPIROMETER AND FLUTTER VALVE FREQUENTLY until you are better   Increase activity slowly   Complete by: As directed        Discharge Exam: Filed Weights   07/06/24 0146 07/06/24 0147  Weight: 108.9 kg 90.7 kg    General: Pt is alert, awake, not in acute distress Cardiovascular: RRR, nl S1-S2, no  murmurs appreciated.   No LE edema.   Respiratory: Normal respiratory rate and rhythm.  Rales bilaterally. Abdominal: Abdomen soft and non-tender.  No distension or HSM.   Neuro/Psych: Strength symmetric in upper and lower extremities.  Judgment and insight appear normal.   Condition at discharge: good  The results of significant diagnostics from this hospitalization (including imaging, microbiology, ancillary and laboratory) are listed below for reference.   Imaging Studies: CT Chest Wo Contrast Result Date: 07/06/2024 CLINICAL DATA:  Respiratory illness. EXAM: CT CHEST WITHOUT CONTRAST TECHNIQUE: Multidetector CT imaging of the chest was performed following the standard protocol without IV contrast. RADIATION DOSE REDUCTION:  This exam was performed according to the departmental dose-optimization program which includes automated exposure control, adjustment of the mA and/or kV according to patient size and/or use of iterative reconstruction technique. COMPARISON:  02/20/2021 FINDINGS: Cardiovascular: The heart size is normal. No substantial pericardial effusion. Coronary artery calcification is evident. Mild atherosclerotic calcification is noted in the wall of the thoracic aorta. Enlargement of the pulmonary outflow tract/main pulmonary arteries suggests pulmonary arterial hypertension. Mediastinum/Nodes: 13 mm right thyroid nodule again identified. This has been evaluated on previous imaging. (ref: J Am Coll Radiol. 2015 Feb;12(2): 143-50).No mediastinal lymphadenopathy. No evidence for gross hilar lymphadenopathy although assessment is limited by the lack of intravenous contrast on the current study. The esophagus has normal imaging features. There is no axillary lymphadenopathy. Lungs/Pleura: 4 mm right perifissural nodule on 59/4 is likely a subpleural lymph node, stable. Interval development of circumferential wall thickening with mild bronchiectasis and patchy consolidative airspace disease in  the lower lobes bilaterally including dominant 18 mm nodular component in the left lower lobe on 83/4. There is some atelectasis in the right middle lobe and lingula. No substantial pleural effusion. Upper Abdomen: Visualized portion of the upper abdomen shows no acute findings. Musculoskeletal: No worrisome lytic or sclerotic osseous abnormality. IMPRESSION: 1. Interval development of circumferential wall thickening with mild bronchiectasis and patchy consolidative airspace disease in the lower lobes bilaterally including dominant 18 mm nodular component in the left lower lobe. Imaging features are compatible with multifocal pneumonia. Given nodular character in some regions, close follow-up warranted to ensure complete resolution. 2. Enlargement of the pulmonary outflow tract/main pulmonary arteries suggests pulmonary arterial hypertension. 3.  Aortic Atherosclerosis (ICD10-I70.0). Electronically Signed   By: Camellia Candle M.D.   On: 07/06/2024 06:03   DG Chest Port 1 View Result Date: 07/06/2024 EXAM: 1 VIEW XRAY OF THE CHEST 07/06/2024 02:42:12 AM COMPARISON: Comparison with 02/11/2024. CLINICAL HISTORY: Questionable sepsis - evaluate for abnormality. FINDINGS: LUNGS AND PLEURA: No focal pulmonary opacity. No pulmonary edema. No pleural effusion. No pneumothorax. HEART AND MEDIASTINUM: Stable cardiomediastinal silhouette. Aortic atherosclerotic calcification. BONES AND SOFT TISSUES: No acute osseous abnormality. IMPRESSION: 1. No acute abnormalities. Electronically signed by: Norman Gatlin MD 07/06/2024 02:47 AM EDT RP Workstation: [id number]    Microbiology: Results for orders placed or performed during the hospital encounter of 07/06/24  Resp panel by RT-PCR (RSV, Flu A&B, Covid) Anterior Nasal Swab     Status: None   Collection Time: 07/06/24  1:35 AM   Specimen: Anterior Nasal Swab  Result Value Ref Range Status   SARS Coronavirus 2 by RT PCR NEGATIVE NEGATIVE Final   Influenza A by PCR  NEGATIVE NEGATIVE Final   Influenza B by PCR NEGATIVE NEGATIVE Final    Comment: (NOTE) The Xpert Xpress SARS-CoV-2/FLU/RSV plus assay is intended as an aid in the diagnosis of influenza from Nasopharyngeal swab specimens and should not be used as a sole basis for treatment. Nasal washings and aspirates are unacceptable for Xpert Xpress SARS-CoV-2/FLU/RSV testing.  Fact Sheet for Patients: BloggerCourse.com  Fact Sheet for Healthcare Providers: SeriousBroker.it  This test is not yet approved or cleared by the [place]  FDA and has been authorized for detection and/or diagnosis of SARS-CoV-2 by FDA under an Emergency Use Authorization (EUA). This EUA will remain in effect (meaning this test can be used) for the duration of the COVID-19 declaration under Section 564(b)(1) of the Act, 21 U.S.C. section 360bbb-3(b)(1), unless the authorization is terminated or revoked.     Resp Syncytial Virus by  PCR NEGATIVE NEGATIVE Final    Comment: (NOTE) Fact Sheet for Patients: BloggerCourse.com  Fact Sheet for Healthcare Providers: SeriousBroker.it  This test is not yet approved or cleared by the [place]  FDA and has been authorized for detection and/or diagnosis of SARS-CoV-2 by FDA under an Emergency Use Authorization (EUA). This EUA will remain in effect (meaning this test can be used) for the duration of the COVID-19 declaration under Section 564(b)(1) of the Act, 21 U.S.C. section 360bbb-3(b)(1), unless the authorization is terminated or revoked.  Performed at [facility], [address] [place], [place] [address]   Respiratory (~20 pathogens) panel by PCR     Status: None   Collection Time: 07/06/24  1:35 AM   Specimen: Nasopharyngeal Swab; Respiratory  Result Value Ref Range Status   Adenovirus NOT DETECTED NOT DETECTED Final   Coronavirus 229E NOT DETECTED NOT  DETECTED Final    Comment: (NOTE) The Coronavirus on the Respiratory Panel, DOES NOT test for the novel  Coronavirus (2019 nCoV)    Coronavirus HKU1 NOT DETECTED NOT DETECTED Final   Coronavirus NL63 NOT DETECTED NOT DETECTED Final   Coronavirus OC43 NOT DETECTED NOT DETECTED Final   Metapneumovirus NOT DETECTED NOT DETECTED Final   Rhinovirus / Enterovirus NOT DETECTED NOT DETECTED Final   Influenza A NOT DETECTED NOT DETECTED Final   Influenza B NOT DETECTED NOT DETECTED Final   Parainfluenza Virus 1 NOT DETECTED NOT DETECTED Final   Parainfluenza Virus 2 NOT DETECTED NOT DETECTED Final   Parainfluenza Virus 3 NOT DETECTED NOT DETECTED Final   Parainfluenza Virus 4 NOT DETECTED NOT DETECTED Final   Respiratory Syncytial Virus NOT DETECTED NOT DETECTED Final   Bordetella pertussis NOT DETECTED NOT DETECTED Final   Bordetella Parapertussis NOT DETECTED NOT DETECTED Final   Chlamydophila pneumoniae NOT DETECTED NOT DETECTED Final   Mycoplasma pneumoniae NOT DETECTED NOT DETECTED Final    Comment: Performed at [facility], [address] [place], [place] [address]  Blood Culture (routine x 2)     Status: None (Preliminary result)   Collection Time: 07/06/24  2:19 AM   Specimen: BLOOD  Result Value Ref Range Status   Specimen Description BLOOD RIGHT ANTECUBITAL  Final   Special Requests   Final    BOTTLES DRAWN AEROBIC AND ANAEROBIC Blood Culture adequate volume   Culture   Final    NO GROWTH 2 DAYS Performed at [facility], [address] [place], [place] [address]    Report Status PENDING  Incomplete  Blood Culture (routine x 2)     Status: None (Preliminary result)   Collection Time: 07/06/24  2:27 AM   Specimen: BLOOD  Result Value Ref Range Status   Specimen Description BLOOD BLOOD LEFT WRIST  Final   Special Requests   Final    BOTTLES DRAWN AEROBIC AND ANAEROBIC Blood Culture adequate volume   Culture   Final    NO GROWTH 2 DAYS Performed at [facility], [address] [place], [place] [address]    Report Status PENDING  Incomplete    Labs: CBC: Recent Labs  Lab 07/06/24 0214 07/07/24 0256 07/08/24 1002  WBC 12.1* 9.3 8.0  NEUTROABS 11.0*  --   --   HGB 11.6* 9.6* 9.7*  HCT 37.3 30.0* 31.5*  MCV 81.3 80.2 81.4  PLT 211 186 203   Basic Metabolic Panel: Recent Labs  Lab 07/06/24 0214 07/07/24 0256 07/08/24 1002  NA 137  141 142  K 3.0* 3.9 3.4*  CL 96* 103 105  CO2 27 29 26   GLUCOSE 354* 163* 180*  BUN 10 17 10   CREATININE 1.19* 1.40* 1.39*  CALCIUM  8.6* 8.4* 8.3*   Liver Function Tests: Recent Labs  Lab 07/06/24 0214 07/07/24 0256  AST 44* 63*  ALT 28 43  ALKPHOS 210* 147*  BILITOT 1.0 1.4*  PROT 7.2 5.2*  ALBUMIN 3.3* 2.4*   CBG: Recent Labs  Lab 07/07/24 1545 07/07/24 2001 07/08/24 0815 07/08/24 0847 07/08/24 1153  GLUCAP 118* 162* 69* 86 224*    Discharge time spent: approximately 45 minutes spent on discharge counseling, evaluation of patient on day of discharge, and coordination of discharge planning with nursing, social work, pharmacy and case management  Signed: Lonni SHAUNNA Dalton, MD [facility] 07/08/2024

## 2024-07-10 ENCOUNTER — Other Ambulatory Visit (HOSPITAL_COMMUNITY): Payer: Self-pay

## 2024-07-11 LAB — CULTURE, BLOOD (ROUTINE X 2)
Culture: NO GROWTH
Culture: NO GROWTH
Special Requests: ADEQUATE
Special Requests: ADEQUATE

## 2024-11-10 ENCOUNTER — Encounter (HOSPITAL_COMMUNITY): Payer: Self-pay

## 2024-11-10 ENCOUNTER — Other Ambulatory Visit (HOSPITAL_COMMUNITY): Payer: Self-pay
